# Patient Record
Sex: Female | Born: 1975 | Race: White | Hispanic: No | Marital: Married | Smoking: Never smoker
Health system: Southern US, Community
[De-identification: ages and names within clinical notes are randomized; demographics above are authoritative.]

## PROBLEM LIST (undated history)

## (undated) DIAGNOSIS — R011 Cardiac murmur, unspecified: Secondary | ICD-10-CM

## (undated) DIAGNOSIS — D649 Anemia, unspecified: Secondary | ICD-10-CM

## (undated) HISTORY — DX: Cardiac murmur, unspecified: R01.1

## (undated) HISTORY — DX: Anemia, unspecified: D64.9

## (undated) HISTORY — PX: WISDOM TOOTH EXTRACTION: SHX21

---

## 2011-01-13 ENCOUNTER — Ambulatory Visit (INDEPENDENT_AMBULATORY_CARE_PROVIDER_SITE_OTHER): Payer: BC Managed Care – PPO | Admitting: Family

## 2011-01-13 ENCOUNTER — Other Ambulatory Visit: Payer: Self-pay | Admitting: Obstetrics & Gynecology

## 2011-01-13 ENCOUNTER — Encounter: Payer: Self-pay | Admitting: Family

## 2011-01-13 VITALS — BP 139/74 | Temp 98.6°F | Ht 66.0 in | Wt 164.0 lb

## 2011-01-13 DIAGNOSIS — Z3480 Encounter for supervision of other normal pregnancy, unspecified trimester: Secondary | ICD-10-CM

## 2011-01-13 DIAGNOSIS — Z113 Encounter for screening for infections with a predominantly sexual mode of transmission: Secondary | ICD-10-CM

## 2011-01-13 DIAGNOSIS — Z3689 Encounter for other specified antenatal screening: Secondary | ICD-10-CM

## 2011-01-13 DIAGNOSIS — O283 Abnormal ultrasonic finding on antenatal screening of mother: Secondary | ICD-10-CM

## 2011-01-13 DIAGNOSIS — Z641 Problems related to multiparity: Secondary | ICD-10-CM

## 2011-01-13 DIAGNOSIS — Z1272 Encounter for screening for malignant neoplasm of vagina: Secondary | ICD-10-CM

## 2011-01-13 DIAGNOSIS — IMO0002 Reserved for concepts with insufficient information to code with codable children: Secondary | ICD-10-CM

## 2011-01-13 DIAGNOSIS — O289 Unspecified abnormal findings on antenatal screening of mother: Secondary | ICD-10-CM

## 2011-01-13 DIAGNOSIS — O09529 Supervision of elderly multigravida, unspecified trimester: Secondary | ICD-10-CM

## 2011-01-13 NOTE — Progress Notes (Signed)
Pt here for initial OB exam; relatively normal ob history; reviewed genetic screening > declined.  Exam completed; will schedule [place] and prenatal panel.    Subjective:    Lindsey Porter is a [id number] [redacted]w[redacted]d being seen today for her first obstetrical visit.  Her obstetrical history is significant for advanced maternal age. Patient does intend to breast feed. Pregnancy history fully reviewed.  Patient reports no complaints.  Filed Vitals:   01/13/11 0908 01/13/11 0958  BP: 139/74   Temp: 98.6 F (37 C)   Height:  5\' 6"  (1.676 m)  Weight: 164 lb (74.39 kg)     HISTORY: OB History    Grav Para Term Preterm Abortions TAB SAB Ect Mult Living   8 7 7      1 8      # Outc Date GA Lbr Len/2nd Wgt Sex Del Anes PTL Lv   1 TRM 7/95 [redacted]w[redacted]d  7lb7oz(3.374kg) F SVD None No Yes   2 TRM 3/97 [redacted]w[redacted]d  7lb9oz(3.43kg) M SVD None No Yes   3 TRM 4/00 [redacted]w[redacted]d  8lb2oz(3.685kg) M SVD None No Yes   4 TRM 7/03 [redacted]w[redacted]d  8lb5oz(3.771kg) F SVD None No Yes   5A TRM 7/05 [redacted]w[redacted]d  6lb9oz(2.977kg) M SVD None No Yes   5B  7/05 [redacted]w[redacted]d  5lb11oz(2.58kg) M SVD None No Yes   6 TRM 4/09 [redacted]w[redacted]d  8lb7oz(3.827kg) M SVD None No Yes   7 TRM 11/10 [redacted]w[redacted]d  8lb9oz(3.884kg) M SVD None No Yes   8 CUR              Past Medical History  Diagnosis Date  . Heart murmur   . Anemia     35 yo   Past Surgical History  Procedure Date  . Wisdom tooth extraction    No family history on file.   Exam    Uterine Size: 21 cm  Pelvic Exam:    Perineum: Hemorrhoids   Vulva: normal   Vagina:  normal mucosa   pH: n/a   Cervix: multiparous appearance, no bleeding following Pap and no cervical motion tenderness   Adnexa: normal adnexa   Bony Pelvis: proven to 8  System: Breast:  normal appearance, no masses or tenderness   Skin: normal coloration and turgor, no rashes    Neurologic: oriented, normal   Extremities: normal strength, tone, and muscle mass   HEENT extra ocular movement intact   Mouth/Teeth mucous membranes moist, pharynx  normal without lesions   Neck supple and no masses   Cardiovascular: regular rate and rhythm, no murmurs or gallops   Respiratory:  appears well, vitals normal, no respiratory distress, acyanotic, normal RR, neck free of mass or lymphadenopathy, chest clear, no wheezing, crepitations, rhonchi, normal symmetric air entry   Abdomen: soft, non-tender; bowel sounds normal; no masses,  no organomegaly   Urinary: urethral meatus normal      Assessment:    Pregnancy: [id number] There is no problem list on file for this patient.       Plan:     Initial labs drawn. Prenatal vitamins. Problem list reviewed and updated. Genetic Screening discussed: declined.  Ultrasound discussed; fetal survey: requested.  Follow up in 4 weeks.  [facility] 01/13/2011

## 2011-01-13 NOTE — Progress Notes (Signed)
p-92 

## 2011-01-14 LAB — HIV ANTIBODY (ROUTINE TESTING W REFLEX): HIV: NONREACTIVE

## 2011-01-14 LAB — OBSTETRIC PANEL
Basophils Absolute: 0 10*3/uL (ref 0.0–0.1)
Hepatitis B Surface Ag: NEGATIVE
Lymphocytes Relative: 14 % (ref 12–46)
Neutro Abs: 6.3 10*3/uL (ref 1.7–7.7)
Neutrophils Relative %: 80 % — ABNORMAL HIGH (ref 43–77)
Platelets: 249 10*3/uL (ref 150–400)
RBC: 3.5 MIL/uL — ABNORMAL LOW (ref 3.87–5.11)
RDW: 14.7 % (ref 11.5–15.5)
Rubella: 410.9 IU/mL — ABNORMAL HIGH
WBC: 7.9 10*3/uL (ref 4.0–10.5)

## 2011-01-16 LAB — GC/CHLAMYDIA PROBE AMP, URINE
Chlamydia, Swab/Urine, PCR: NEGATIVE
GC Probe Amp, Urine: NEGATIVE

## 2011-01-19 ENCOUNTER — Ambulatory Visit (HOSPITAL_COMMUNITY)
Admission: RE | Admit: 2011-01-19 | Discharge: 2011-01-19 | Disposition: A | Discharge status: Home / Self Care | Payer: BC Managed Care – PPO | Source: Ambulatory Visit | Attending: Obstetrics & Gynecology | Admitting: Obstetrics & Gynecology

## 2011-01-19 DIAGNOSIS — Z1389 Encounter for screening for other disorder: Secondary | ICD-10-CM | POA: Insufficient documentation

## 2011-01-19 DIAGNOSIS — O358XX0 Maternal care for other (suspected) fetal abnormality and damage, not applicable or unspecified: Secondary | ICD-10-CM | POA: Insufficient documentation

## 2011-01-19 DIAGNOSIS — O09529 Supervision of elderly multigravida, unspecified trimester: Secondary | ICD-10-CM | POA: Insufficient documentation

## 2011-01-19 DIAGNOSIS — Z363 Encounter for antenatal screening for malformations: Secondary | ICD-10-CM | POA: Insufficient documentation

## 2011-01-19 DIAGNOSIS — Z3689 Encounter for other specified antenatal screening: Secondary | ICD-10-CM

## 2011-01-20 LAB — CULTURE, URINE COMPREHENSIVE

## 2011-01-23 ENCOUNTER — Telehealth: Payer: Self-pay | Admitting: Advanced Practice Midwife

## 2011-01-25 ENCOUNTER — Telehealth: Payer: Self-pay | Admitting: *Deleted

## 2011-01-25 DIAGNOSIS — O359XX0 Maternal care for (suspected) fetal abnormality and damage, unspecified, not applicable or unspecified: Secondary | ICD-10-CM

## 2011-01-25 NOTE — Telephone Encounter (Signed)
Pt notified of anatomy u/s fo echogenic focal area with the fetus heart and that due to her AMA her risk for Down's syndrome is 1:79.  Quad screen was offered but pt declines.  Will schedule repeat U/S in 3 weeks.

## 2011-01-25 NOTE — Telephone Encounter (Signed)
Addended by: Lesly Dukes on: 01/25/2011 12:02 PM   Modules accepted: Orders

## 2011-02-09 ENCOUNTER — Ambulatory Visit (HOSPITAL_COMMUNITY)
Admission: RE | Admit: 2011-02-09 | Discharge: 2011-02-09 | Disposition: A | Discharge status: Home / Self Care | Payer: BC Managed Care – PPO | Source: Ambulatory Visit | Attending: Obstetrics & Gynecology | Admitting: Obstetrics & Gynecology

## 2011-02-09 DIAGNOSIS — O09529 Supervision of elderly multigravida, unspecified trimester: Secondary | ICD-10-CM | POA: Insufficient documentation

## 2011-02-09 DIAGNOSIS — O359XX0 Maternal care for (suspected) fetal abnormality and damage, unspecified, not applicable or unspecified: Secondary | ICD-10-CM

## 2011-02-09 DIAGNOSIS — Z3689 Encounter for other specified antenatal screening: Secondary | ICD-10-CM | POA: Insufficient documentation

## 2011-02-09 DIAGNOSIS — O358XX0 Maternal care for other (suspected) fetal abnormality and damage, not applicable or unspecified: Secondary | ICD-10-CM | POA: Insufficient documentation

## 2011-02-13 ENCOUNTER — Ambulatory Visit (INDEPENDENT_AMBULATORY_CARE_PROVIDER_SITE_OTHER): Payer: BC Managed Care – PPO | Admitting: Advanced Practice Midwife

## 2011-02-13 DIAGNOSIS — O0940 Supervision of pregnancy with grand multiparity, unspecified trimester: Secondary | ICD-10-CM

## 2011-02-13 DIAGNOSIS — O358XX0 Maternal care for other (suspected) fetal abnormality and damage, not applicable or unspecified: Secondary | ICD-10-CM

## 2011-02-13 NOTE — Progress Notes (Signed)
Reviewed F/U US. LVEIF seen. No other soft markers. CL 3.63 cm Discussed relationship btw LVEIF and Trisomy 21. Pt not interested in amnio. 1 hour GTT at [place].   Lindsey Porter 02/13/2011 8:30 AM

## 2011-02-13 NOTE — Progress Notes (Signed)
p=91 

## 2011-02-13 NOTE — Patient Instructions (Signed)
Pregnancy - Second Trimester The second trimester of pregnancy (3 to 6 months) is a period of rapid growth for you and your baby. At the end of the sixth month, your baby is about 9 inches long and weighs 1 1/2 pounds. You will begin to feel the baby move between 18 and 20 weeks of the pregnancy. This is called quickening. Weight gain is faster. A clear fluid (colostrum) may leak out of your breasts. You may feel small contractions of the womb (uterus). This is known as false labor or Braxton-Hicks contractions. This is like a practice for labor when the baby is ready to be born. Usually, the problems with morning sickness have usually passed by the end of your first trimester. Some women develop small dark blotches (called cholasma, mask of pregnancy) on their face that usually goes away after the baby is born. Exposure to the sun makes the blotches worse. Acne may also develop in some pregnant women and pregnant women who have acne, may find that it goes away. PRENATAL EXAMS  Blood work may continue to be done during prenatal exams. These tests are done to check on your health and the probable health of your baby. Blood work is used to follow your blood levels (hemoglobin). Anemia (low hemoglobin) is common during pregnancy. Iron and vitamins are given to help prevent this. You will also be checked for diabetes between 24 and 28 weeks of the pregnancy. Some of the previous blood tests may be repeated.   The size of the uterus is measured during each visit. This is to make sure that the baby is continuing to grow properly according to the dates of the pregnancy.   Your blood pressure is checked every prenatal visit. This is to make sure you are not getting toxemia.   Your urine is checked to make sure you do not have an infection, diabetes or protein in the urine.   Your weight is checked often to make sure gains are happening at the suggested rate. This is to ensure that both you and your baby are  growing normally.   Sometimes, an ultrasound is performed to confirm the proper growth and development of the baby. This is a test which bounces harmless sound waves off the baby so your caregiver can more accurately determine due dates.  Sometimes, a specialized test is done on the amniotic fluid surrounding the baby. This test is called an amniocentesis. The amniotic fluid is obtained by sticking a needle into the belly (abdomen). This is done to check the chromosomes in instances where there is a concern about possible genetic problems with the baby. It is also sometimes done near the end of pregnancy if an early delivery is required. In this case, it is done to help make sure the baby's lungs are mature enough for the baby to live outside of the womb. CHANGES OCCURING IN THE SECOND TRIMESTER OF PREGNANCY Your body goes through many changes during pregnancy. They vary from person to person. Talk to your caregiver about changes you notice that you are concerned about.  During the second trimester, you will likely have an increase in your appetite. It is normal to have cravings for certain foods. This varies from person to person and pregnancy to pregnancy.   Your lower abdomen will begin to bulge.   You may have to urinate more often because the uterus and baby are pressing on your bladder. It is also common to get more bladder infections during pregnancy (  pain with urination). You can help this by drinking lots of fluids and emptying your bladder before and after intercourse.   You may begin to get stretch marks on your hips, abdomen, and breasts. These are normal changes in the body during pregnancy. There are no exercises or medications to take that prevent this change.   You may begin to develop swollen and bulging veins (varicose veins) in your legs. Wearing support hose, elevating your feet for 15 minutes, 3 to 4 times a day and limiting salt in your diet helps lessen the problem.    Heartburn may develop as the uterus grows and pushes up against the stomach. Antacids recommended by your caregiver helps with this problem. Also, eating smaller meals 4 to 5 times a day helps.   Constipation can be treated with a stool softener or adding bulk to your diet. Drinking lots of fluids, vegetables, fruits, and whole grains are helpful.   Exercising is also helpful. If you have been very active up until your pregnancy, most of these activities can be continued during your pregnancy. If you have been less active, it is helpful to start an exercise program such as walking.   Hemorrhoids (varicose veins in the rectum) may develop at the end of the second trimester. Warm sitz baths and hemorrhoid cream recommended by your caregiver helps hemorrhoid problems.   Backaches may develop during this time of your pregnancy. Avoid heavy lifting, wear low heal shoes and practice good posture to help with backache problems.   Some pregnant women develop tingling and numbness of their hand and fingers because of swelling and tightening of ligaments in the wrist (carpel tunnel syndrome). This goes away after the baby is born.   As your breasts enlarge, you may have to get a bigger bra. Get a comfortable, cotton, support bra. Do not get a nursing bra until the last month of the pregnancy if you will be nursing the baby.   You may get a dark line from your belly button to the pubic area called the linea nigra.   You may develop rosy cheeks because of increase blood flow to the face.   You may develop spider looking lines of the face, neck, arms and chest. These go away after the baby is born.  HOME CARE INSTRUCTIONS   It is extremely important to avoid all smoking, herbs, alcohol, and unprescribed drugs during your pregnancy. These chemicals affect the formation and growth of the baby. Avoid these chemicals throughout the pregnancy to ensure the delivery of a healthy infant.   Most of your home  care instructions are the same as suggested for the first trimester of your pregnancy. Keep your caregiver's appointments. Follow your caregiver's instructions regarding medication use, exercise and diet.   During pregnancy, you are providing food for you and your baby. Continue to eat regular, well-balanced meals. Choose foods such as meat, fish, milk and other low fat dairy products, vegetables, fruits, and whole-grain breads and cereals. Your caregiver will tell you of the ideal weight gain.   A physical sexual relationship may be continued up until near the end of pregnancy if there are no other problems. Problems could include early (premature) leaking of amniotic fluid from the membranes, vaginal bleeding, abdominal pain, or other medical or pregnancy problems.   Exercise regularly if there are no restrictions. Check with your caregiver if you are unsure of the safety of some of your exercises. The greatest weight gain will occur in the   last 2 trimesters of pregnancy. Exercise will help you:   Control your weight.   Get you in shape for labor and delivery.   Lose weight after you have the baby.   Wear a good support or jogging bra for breast tenderness during pregnancy. This may help if worn during sleep. Pads or tissues may be used in the bra if you are leaking colostrum.   Do not use hot tubs, steam rooms or saunas throughout the pregnancy.   Wear your seat belt at all times when driving. This protects you and your baby if you are in an accident.   Avoid raw meat, uncooked cheese, cat litter boxes and soil used by cats. These carry germs that can cause birth defects in the baby.   The second trimester is also a good time to visit your dentist for your dental health if this has not been done yet. Getting your teeth cleaned is OK. Use a soft toothbrush. Brush gently during pregnancy.   It is easier to loose urine during pregnancy. Tightening up and strengthening the pelvic muscles will  help with this problem. Practice stopping your urination while you are going to the bathroom. These are the same muscles you need to strengthen. It is also the muscles you would use as if you were trying to stop from passing gas. You can practice tightening these muscles up 10 times a set and repeating this about 3 times per day. Once you know what muscles to tighten up, do not perform these exercises during urination. It is more likely to contribute to an infection by backing up the urine.   Ask for help if you have financial, counseling or nutritional needs during pregnancy. Your caregiver will be able to offer counseling for these needs as well as refer you for other special needs.   Your skin may become oily. If so, wash your face with mild soap, use non-greasy moisturizer and oil or cream based makeup.  MEDICATIONS AND DRUG USE IN PREGNANCY  Take prenatal vitamins as directed. The vitamin should contain 1 milligram of folic acid. Keep all vitamins out of reach of children. Only a couple vitamins or tablets containing iron may be fatal to a baby or young child when ingested.   Avoid use of all medications, including herbs, over-the-counter medications, not prescribed or suggested by your caregiver. Only take over-the-counter or prescription medicines for pain, discomfort, or fever as directed by your caregiver. Do not use aspirin.   Let your caregiver also know about herbs you may be using.   Alcohol is related to a number of birth defects. This includes fetal alcohol syndrome. All alcohol, in any form, should be avoided completely. Smoking will cause low birth rate and premature babies.   Street or illegal drugs are very harmful to the baby. They are absolutely forbidden. A baby born to an addicted mother will be addicted at birth. The baby will go through the same withdrawal an adult does.  SEEK MEDICAL CARE IF:  You have any concerns or worries during your pregnancy. It is better to call with  your questions if you feel they cannot wait, rather than worry about them. SEEK IMMEDIATE MEDICAL CARE IF:   An unexplained oral temperature above 102 F (38.9 C) develops, or as your caregiver suggests.   You have leaking of fluid from the vagina (birth canal). If leaking membranes are suspected, take your temperature and tell your caregiver of this when you call.   There   is vaginal spotting, bleeding, or passing clots. Tell your caregiver of the amount and how many pads are used. Light spotting in pregnancy is common, especially following intercourse.   You develop a bad smelling vaginal discharge with a change in the color from clear to white.   You continue to feel sick to your stomach (nauseated) and have no relief from remedies suggested. You vomit blood or coffee ground-like materials.   You lose more than 2 pounds of weight or gain more than 2 pounds of weight over 1 week, or as suggested by your caregiver.   You notice swelling of your face, hands, feet, or legs.   You get exposed to German measles and have never had them.   You are exposed to fifth disease or chickenpox.   You develop belly (abdominal) pain. Round ligament discomfort is a common non-cancerous (benign) cause of abdominal pain in pregnancy. Your caregiver still must evaluate you.   You develop a bad headache that does not go away.   You develop fever, diarrhea, pain with urination, or shortness of breath.   You develop visual problems, blurry, or double vision.   You fall or are in a car accident or any kind of trauma.   There is mental or physical violence at home.  Document Released: 03/21/2001 Document Revised: 12/07/2010 Document Reviewed: 09/23/2008 Exit[employer] Patient Information 2012 [employer]. 

## 2011-02-15 ENCOUNTER — Ambulatory Visit (HOSPITAL_COMMUNITY): Payer: BC Managed Care – PPO

## 2011-02-17 NOTE — Telephone Encounter (Signed)
LVEIF on [place]. Called  To discuss w/ pt. Left VM to call [place] office.

## 2011-03-22 ENCOUNTER — Ambulatory Visit (INDEPENDENT_AMBULATORY_CARE_PROVIDER_SITE_OTHER): Payer: BC Managed Care – PPO | Admitting: Obstetrics & Gynecology

## 2011-03-22 VITALS — BP 126/74 | Temp 97.6°F | Wt 175.0 lb

## 2011-03-22 DIAGNOSIS — O0940 Supervision of pregnancy with grand multiparity, unspecified trimester: Secondary | ICD-10-CM

## 2011-03-22 NOTE — Patient Instructions (Signed)
Breastfeeding BENEFITS OF BREASTFEEDING For the baby  The first milk (colostrum) helps the baby's digestive system function better.   There are antibodies from the mother in the milk that help the baby fight off infections.   The baby has a lower incidence of asthma, allergies, and SIDS (sudden infant death syndrome).   The nutrients in breast milk are better than formulas for the baby and helps the baby's brain grow better.   Babies who breastfeed have less gas, colic, and constipation.  For the mother  Breastfeeding helps develop a very special bond between mother and baby.   It is more convenient, always available at the correct temperature and cheaper than formula feeding.   It burns calories in the mother and helps with losing weight that was gained during pregnancy.   It makes the uterus contract back down to normal size faster and slows bleeding following delivery.   Breastfeeding mothers have a lower risk of developing breast cancer.  NURSE FREQUENTLY  A healthy, full-term baby may breastfeed as often as every hour or space his or her feedings to every 3 hours.   How often to nurse will vary from baby to baby. Watch your baby for signs of hunger, not the clock.   Nurse as often as the baby requests, or when you feel the need to reduce the fullness of your breasts.   Awaken the baby if it has been 3 to 4 hours since the last feeding.   Frequent feeding will help the mother make more milk and will prevent problems like sore nipples and engorgement of the breasts.  BABY'S POSITION AT THE BREAST  Whether lying down or sitting, be sure that the baby's tummy is facing your tummy.   Support the breast with 4 fingers underneath the breast and the thumb above. Make sure your fingers are well away from the nipple and baby's mouth.   Stroke the baby's lips and cheek closest to the breast gently with your finger or nipple.   When the baby's mouth is open wide enough, place  all of your nipple and as much of the dark area around the nipple as possible into your baby's mouth.   Pull the baby in close so the tip of the nose and the baby's cheeks touch the breast during the feeding.  FEEDINGS  The length of each feeding varies from baby to baby and from feeding to feeding.   The baby must suck about 2 to 3 minutes for your milk to get to him or her. This is called a "let down." For this reason, allow the baby to feed on each breast as long as he or she wants. Your baby will end the feeding when he or she has received the right balance of nutrients.   To break the suction, put your finger into the corner of the baby's mouth and slide it between his or her gums before removing your breast from his or her mouth. This will help prevent sore nipples.  REDUCING BREAST ENGORGEMENT  In the first week after your baby is born, you may experience signs of breast engorgement. When breasts are engorged, they feel heavy, warm, full, and may be tender to the touch. You can reduce engorgement if you:   Nurse frequently, every 2 to 3 hours. Mothers who breastfeed early and often have fewer problems with engorgement.   Place light ice packs on your breasts between feedings. This reduces swelling. Wrap the ice packs in a   lightweight towel to protect your skin.   Apply moist hot packs to your breast for 5 to 10 minutes before each feeding. This increases circulation and helps the milk flow.   Gently massage your breast before and during the feeding.   Make sure that the baby empties at least one breast at every feeding before switching sides.   Use a breast pump to empty the breasts if your baby is sleepy or not nursing well. You may also want to pump if you are returning to work or or you feel you are getting engorged.   Avoid bottle feeds, pacifiers or supplemental feedings of water or juice in place of breastfeeding.   Be sure the baby is latched on and positioned properly while  breastfeeding.   Prevent fatigue, stress, and anemia.   Wear a supportive bra, avoiding underwire styles.   Eat a balanced diet with enough fluids.  If you follow these suggestions, your engorgement should improve in 24 to 48 hours. If you are still experiencing difficulty, call your lactation consultant or caregiver. IS MY BABY GETTING ENOUGH MILK? Sometimes, mothers worry about whether their babies are getting enough milk. You can be assured that your baby is getting enough milk if:  The baby is actively sucking and you hear swallowing.   The baby nurses at least 8 to 12 times in a 24 hour time period. Nurse your baby until he or she unlatches or falls asleep at the first breast (at least 10 to 20 minutes), then offer the second side.   The baby is wetting 5 to 6 disposable diapers (6 to 8 cloth diapers) in a 24 hour period by 5 to 6 days of age.   The baby is having at least 2 to 3 stools every 24 hours for the first few months. Breast milk is all the food your baby needs. It is not necessary for your baby to have water or formula. In fact, to help your breasts make more milk, it is best not to give your baby supplemental feedings during the early weeks.   The stool should be soft and yellow.   The baby should gain 4 to 7 ounces per week after he is 4 days old.  TAKE CARE OF YOURSELF Take care of your breasts by:  Bathing or showering daily.   Avoiding the use of soaps on your nipples.   Start feedings on your left breast at one feeding and on your right breast at the next feeding.   You will notice an increase in your milk supply 2 to 5 days after delivery. You may feel some discomfort from engorgement, which makes your breasts very firm and often tender. Engorgement "peaks" out within 24 to 48 hours. In the meantime, apply warm moist towels to your breasts for 5 to 10 minutes before feeding. Gentle massage and expression of some milk before feeding will soften your breasts, making  it easier for your baby to latch on. Wear a well fitting nursing bra and air dry your nipples for 10 to 15 minutes after each feeding.   Only use cotton bra pads.   Only use pure lanolin on your nipples after nursing. You do not need to wash it off before nursing.  Take care of yourself by:   Eating well-balanced meals and nutritious snacks.   Drinking milk, fruit juice, and water to satisfy your thirst (about 8 glasses a day).   Getting plenty of rest.   Increasing calcium in   your diet (1200 mg a day).   Avoiding foods that you notice affect the baby in a bad way.  SEEK MEDICAL CARE IF:   You have any questions or difficulty with breastfeeding.   You need help.   You have a hard, red, sore area on your breast, accompanied by a fever of 100.5 F (38.1 C) or more.   Your baby is too sleepy to eat well or is having trouble sleeping.   Your baby is wetting less than 6 diapers per day, by 5 days of age.   Your baby's skin or white part of his or her eyes is more yellow than it was in the hospital.   You feel depressed.  Document Released: 03/27/2005 Document Revised: 12/07/2010 Document Reviewed: 11/09/2008 [employer] Patient Information 2012 [employer]. 

## 2011-03-22 NOTE — Progress Notes (Signed)
No problems.  GCT today.  [employer] [place] not paying for midwifery visits.  Pt will call onemore time.  If still not getting them to pay, will have Jasmine December call.  GCT today.

## 2011-03-22 NOTE — Progress Notes (Signed)
Addended by: Granville Lewis on: 03/22/2011 11:53 AM   Modules accepted: Orders

## 2011-03-22 NOTE — Progress Notes (Signed)
P - 97 

## 2011-03-23 ENCOUNTER — Telehealth: Payer: Self-pay | Admitting: *Deleted

## 2011-03-23 LAB — CBC
MCH: 31.5 pg (ref 26.0–34.0)
MCHC: 31.8 g/dL (ref 30.0–36.0)
MCV: 99.1 fL (ref 78.0–100.0)
Platelets: 265 10*3/uL (ref 150–400)
RDW: 14 % (ref 11.5–15.5)

## 2011-03-23 LAB — RPR

## 2011-03-23 LAB — GLUCOSE TOLERANCE, 1 HOUR: Glucose, 1 Hour GTT: 125 mg/dL (ref 70–140)

## 2011-03-23 NOTE — Telephone Encounter (Signed)
Left message on home phone that her 1 hr Glucola test was within normal range.

## 2011-04-11 NOTE — L&D Delivery Note (Signed)
Delivery Note At 12:36 AM a viable and healthy female was delivered via Vaginal, Spontaneous Delivery (Presentation: ; Occiput Anterior).  APGAR: 8, 9; weight 9 lb 1.5 oz (4125 g).   Placenta status: Intact, Spontaneous.  Cord: 3 vessels with the following complications: Marland Kitchen  Mild shoulder dystocia  After delivery of head, attempted delivery of anterior shoulder without success. McRoberts employed and attempted delivery of posterior shoulder Suprapubic pressure applied and I was able to deliver the posterior shoulder. Total time was less than a minute. Good movement of baby arms bilaterally after birth  Anesthesia: None  Episiotomy: None Lacerations: abrasion midline perineum, not bleeding, not repaired Suture Repair:  Est. Blood Loss (mL): 100  Mom to postpartum.  Baby to nursery-stable.  [facility] 06/15/2011, 1:40 AM

## 2011-04-14 ENCOUNTER — Ambulatory Visit (INDEPENDENT_AMBULATORY_CARE_PROVIDER_SITE_OTHER): Payer: 59 | Admitting: Advanced Practice Midwife

## 2011-04-14 VITALS — BP 121/66 | Temp 97.1°F | Wt 172.0 lb

## 2011-04-14 DIAGNOSIS — Z3480 Encounter for supervision of other normal pregnancy, unspecified trimester: Secondary | ICD-10-CM

## 2011-04-14 DIAGNOSIS — J069 Acute upper respiratory infection, unspecified: Secondary | ICD-10-CM

## 2011-04-14 DIAGNOSIS — O0940 Supervision of pregnancy with grand multiparity, unspecified trimester: Secondary | ICD-10-CM

## 2011-04-14 NOTE — Progress Notes (Signed)
p-99  URI

## 2011-04-14 NOTE — Progress Notes (Signed)
MHR 88. Whole family has URI. Discussed comfort measures, meds,increase rest and fluids.

## 2011-04-14 NOTE — Patient Instructions (Signed)
Pregnancy - Third Trimester The third trimester of pregnancy (the last 3 months) is a period of the most rapid growth for you and your baby. The baby approaches a length of 20 inches and a weight of 6 to 10 pounds. The baby is adding on fat and getting ready for life outside your body. While inside, babies have periods of sleeping and waking, suck their thumbs, and hiccups. You can often feel small contractions of the uterus. This is false labor. It is also called Braxton-Hicks contractions. This is like a practice for labor. The usual problems in this stage of pregnancy include more difficulty breathing, swelling of the hands and feet from water retention, and having to urinate more often because of the uterus and baby pressing on your bladder.  PRENATAL EXAMS  Blood work may continue to be done during prenatal exams. These tests are done to check on your health and the probable health of your baby. Blood work is used to follow your blood levels (hemoglobin). Anemia (low hemoglobin) is common during pregnancy. Iron and vitamins are given to help prevent this. You may also continue to be checked for diabetes. Some of the past blood tests may be done again.   The size of the uterus is measured during each visit. This makes sure your baby is growing properly according to your pregnancy dates.   Your blood pressure is checked every prenatal visit. This is to make sure you are not getting toxemia.   Your urine is checked every prenatal visit for infection, diabetes and protein.   Your weight is checked at each visit. This is done to make sure gains are happening at the suggested rate and that you and your baby are growing normally.   Sometimes, an ultrasound is performed to confirm the position and the proper growth and development of the baby. This is a test done that bounces harmless sound waves off the baby so your caregiver can more accurately determine due dates.   Discuss the type of pain  medication and anesthesia you will have during your labor and delivery.   Discuss the possibility and anesthesia if a Cesarean Section might be necessary.   Inform your caregiver if there is any mental or physical violence at home.  Sometimes, a specialized non-stress test, contraction stress test and biophysical profile are done to make sure the baby is not having a problem. Checking the amniotic fluid surrounding the baby is called an amniocentesis. The amniotic fluid is removed by sticking a needle into the belly (abdomen). This is sometimes done near the end of pregnancy if an early delivery is required. In this case, it is done to help make sure the baby's lungs are mature enough for the baby to live outside of the womb. If the lungs are not mature and it is unsafe to deliver the baby, an injection of cortisone medication is given to the mother 1 to 2 days before the delivery. This helps the baby's lungs mature and makes it safer to deliver the baby. CHANGES OCCURING IN THE THIRD TRIMESTER OF PREGNANCY Your body goes through many changes during pregnancy. They vary from person to person. Talk to your caregiver about changes you notice and are concerned about.  During the last trimester, you have probably had an increase in your appetite. It is normal to have cravings for certain foods. This varies from person to person and pregnancy to pregnancy.   You may begin to get stretch marks on your hips,   abdomen, and breasts. These are normal changes in the body during pregnancy. There are no exercises or medications to take which prevent this change.   Constipation may be treated with a stool softener or adding bulk to your diet. Drinking lots of fluids, fiber in vegetables, fruits, and whole grains are helpful.   Exercising is also helpful. If you have been very active up until your pregnancy, most of these activities can be continued during your pregnancy. If you have been less active, it is helpful  to start an exercise program such as walking. Consult your caregiver before starting exercise programs.   Avoid all smoking, alcohol, un-prescribed drugs, herbs and "street drugs" during your pregnancy. These chemicals affect the formation and growth of the baby. Avoid chemicals throughout the pregnancy to ensure the delivery of a healthy infant.   Backache, varicose veins and hemorrhoids may develop or get worse.   You will tire more easily in the third trimester, which is normal.   The baby's movements may be stronger and more often.   You may become short of breath easily.   Your belly button may stick out.   A yellow discharge may leak from your breasts called colostrum.   You may have a bloody mucus discharge. This usually occurs a few days to a week before labor begins.  HOME CARE INSTRUCTIONS   Keep your caregiver's appointments. Follow your caregiver's instructions regarding medication use, exercise, and diet.   During pregnancy, you are providing food for you and your baby. Continue to eat regular, well-balanced meals. Choose foods such as meat, fish, milk and other low fat dairy products, vegetables, fruits, and whole-grain breads and cereals. Your caregiver will tell you of the ideal weight gain.   A physical sexual relationship may be continued throughout pregnancy if there are no other problems such as early (premature) leaking of amniotic fluid from the membranes, vaginal bleeding, or belly (abdominal) pain.   Exercise regularly if there are no restrictions. Check with your caregiver if you are unsure of the safety of your exercises. Greater weight gain will occur in the last 2 trimesters of pregnancy. Exercising helps:   Control your weight.   Get you in shape for labor and delivery.   You lose weight after you deliver.   Rest a lot with legs elevated, or as needed for leg cramps or low back pain.   Wear a good support or jogging bra for breast tenderness during  pregnancy. This may help if worn during sleep. Pads or tissues may be used in the bra if you are leaking colostrum.   Do not use hot tubs, steam rooms, or saunas.   Wear your seat belt when driving. This protects you and your baby if you are in an accident.   Avoid raw meat, cat litter boxes and soil used by cats. These carry germs that can cause birth defects in the baby.   It is easier to loose urine during pregnancy. Tightening up and strengthening the pelvic muscles will help with this problem. You can practice stopping your urination while you are going to the bathroom. These are the same muscles you need to strengthen. It is also the muscles you would use if you were trying to stop from passing gas. You can practice tightening these muscles up 10 times a set and repeating this about 3 times per day. Once you know what muscles to tighten up, do not perform these exercises during urination. It is more likely   to cause an infection by backing up the urine.   Ask for help if you have financial, counseling or nutritional needs during pregnancy. Your caregiver will be able to offer counseling for these needs as well as refer you for other special needs.   Make a list of emergency phone numbers and have them available.   Plan on getting help from family or friends when you go home from the hospital.   Make a trial run to the hospital.   Take prenatal classes with the father to understand, practice and ask questions about the labor and delivery.   Prepare the baby's room/nursery.   Do not travel out of the city unless it is absolutely necessary and with the advice of your caregiver.   Wear only low or no heal shoes to have better balance and prevent falling.  MEDICATIONS AND DRUG USE IN PREGNANCY  Take prenatal vitamins as directed. The vitamin should contain 1 milligram of folic acid. Keep all vitamins out of reach of children. Only a couple vitamins or tablets containing iron may be fatal  to a baby or young child when ingested.   Avoid use of all medications, including herbs, over-the-counter medications, not prescribed or suggested by your caregiver. Only take over-the-counter or prescription medicines for pain, discomfort, or fever as directed by your caregiver. Do not use aspirin, ibuprofen (Motrin, Advil, Nuprin) or naproxen (Aleve) unless OK'd by your caregiver.   Let your caregiver also know about herbs you may be using.   Alcohol is related to a number of birth defects. This includes fetal alcohol syndrome. All alcohol, in any form, should be avoided completely. Smoking will cause low birth rate and premature babies.   Street/illegal drugs are very harmful to the baby. They are absolutely forbidden. A baby born to an addicted mother will be addicted at birth. The baby will go through the same withdrawal an adult does.  SEEK MEDICAL CARE IF: You have any concerns or worries during your pregnancy. It is better to call with your questions if you feel they cannot wait, rather than worry about them. DECISIONS ABOUT CIRCUMCISION You may or may not know the sex of your baby. If you know your baby is a boy, it may be time to think about circumcision. Circumcision is the removal of the foreskin of the penis. This is the skin that covers the sensitive end of the penis. There is no proven medical need for this. Often this decision is made on what is popular at the time or based upon religious beliefs and social issues. You can discuss these issues with your caregiver or pediatrician. SEEK IMMEDIATE MEDICAL CARE IF:   An unexplained oral temperature above 102 F (38.9 C) develops, or as your caregiver suggests.   You have leaking of fluid from the vagina (birth canal). If leaking membranes are suspected, take your temperature and tell your caregiver of this when you call.   There is vaginal spotting, bleeding or passing clots. Tell your caregiver of the amount and how many pads are  used.   You develop a bad smelling vaginal discharge with a change in the color from clear to white.   You develop vomiting that lasts more than 24 hours.   You develop chills or fever.   You develop shortness of breath.   You develop burning on urination.   You loose more than 2 pounds of weight or gain more than 2 pounds of weight or as suggested by your   caregiver.   You notice sudden swelling of your face, hands, and feet or legs.   You develop belly (abdominal) pain. Round ligament discomfort is a common non-cancerous (benign) cause of abdominal pain in pregnancy. Your caregiver still must evaluate you.   You develop a severe headache that does not go away.   You develop visual problems, blurred or double vision.   If you have not felt your baby move for more than 1 hour. If you think the baby is not moving as much as usual, eat something with sugar in it and lie down on your left side for an hour. The baby should move at least 4 to 5 times per hour. Call right away if your baby moves less than that.   You fall, are in a car accident or any kind of trauma.   There is mental or physical violence at home.  Document Released: 03/21/2001 Document Revised: 12/07/2010 Document Reviewed: 09/23/2008 [employer] Patient Information 2012 [employer]. 

## 2011-05-05 ENCOUNTER — Other Ambulatory Visit: Payer: Self-pay | Admitting: Family

## 2011-05-05 ENCOUNTER — Ambulatory Visit (INDEPENDENT_AMBULATORY_CARE_PROVIDER_SITE_OTHER): Payer: 59 | Admitting: Family

## 2011-05-05 DIAGNOSIS — O09529 Supervision of elderly multigravida, unspecified trimester: Secondary | ICD-10-CM

## 2011-05-05 DIAGNOSIS — IMO0002 Reserved for concepts with insufficient information to code with codable children: Secondary | ICD-10-CM

## 2011-05-05 DIAGNOSIS — Z3480 Encounter for supervision of other normal pregnancy, unspecified trimester: Secondary | ICD-10-CM

## 2011-05-05 NOTE — Progress Notes (Signed)
Addended by: Melissa Noon on: 05/05/2011 11:39 AM   Modules accepted: Orders

## 2011-05-05 NOTE — Progress Notes (Signed)
p-90  36 wk labs

## 2011-05-05 NOTE — Progress Notes (Signed)
No questions or concerns; collect GBS, GC/CT

## 2011-05-09 LAB — GC/CHLAMYDIA PROBE AMP, GENITAL
Chlamydia, DNA Probe: NEGATIVE
GC Probe Amp, Genital: NEGATIVE

## 2011-05-12 ENCOUNTER — Ambulatory Visit (INDEPENDENT_AMBULATORY_CARE_PROVIDER_SITE_OTHER): Payer: 59 | Admitting: Advanced Practice Midwife

## 2011-05-12 VITALS — BP 116/62 | Temp 98.4°F | Wt 173.0 lb

## 2011-05-12 DIAGNOSIS — Z3480 Encounter for supervision of other normal pregnancy, unspecified trimester: Secondary | ICD-10-CM

## 2011-05-12 NOTE — Progress Notes (Signed)
No concerns. Reviewed neg GBS.

## 2011-05-12 NOTE — Progress Notes (Signed)
p-85 

## 2011-05-12 NOTE — Patient Instructions (Signed)
Pregnancy - Third Trimester The third trimester of pregnancy (the last 3 months) is a period of the most rapid growth for you and your baby. The baby approaches a length of 20 inches and a weight of 6 to 10 pounds. The baby is adding on fat and getting ready for life outside your body. While inside, babies have periods of sleeping and waking, suck their thumbs, and hiccups. You can often feel small contractions of the uterus. This is false labor. It is also called Braxton-Hicks contractions. This is like a practice for labor. The usual problems in this stage of pregnancy include more difficulty breathing, swelling of the hands and feet from water retention, and having to urinate more often because of the uterus and baby pressing on your bladder.  PRENATAL EXAMS  Blood work may continue to be done during prenatal exams. These tests are done to check on your health and the probable health of your baby. Blood work is used to follow your blood levels (hemoglobin). Anemia (low hemoglobin) is common during pregnancy. Iron and vitamins are given to help prevent this. You may also continue to be checked for diabetes. Some of the past blood tests may be done again.   The size of the uterus is measured during each visit. This makes sure your baby is growing properly according to your pregnancy dates.   Your blood pressure is checked every prenatal visit. This is to make sure you are not getting toxemia.   Your urine is checked every prenatal visit for infection, diabetes and protein.   Your weight is checked at each visit. This is done to make sure gains are happening at the suggested rate and that you and your baby are growing normally.   Sometimes, an ultrasound is performed to confirm the position and the proper growth and development of the baby. This is a test done that bounces harmless sound waves off the baby so your caregiver can more accurately determine due dates.   Discuss the type of pain  medication and anesthesia you will have during your labor and delivery.   Discuss the possibility and anesthesia if a Cesarean Section might be necessary.   Inform your caregiver if there is any mental or physical violence at home.  Sometimes, a specialized non-stress test, contraction stress test and biophysical profile are done to make sure the baby is not having a problem. Checking the amniotic fluid surrounding the baby is called an amniocentesis. The amniotic fluid is removed by sticking a needle into the belly (abdomen). This is sometimes done near the end of pregnancy if an early delivery is required. In this case, it is done to help make sure the baby's lungs are mature enough for the baby to live outside of the womb. If the lungs are not mature and it is unsafe to deliver the baby, an injection of cortisone medication is given to the mother 1 to 2 days before the delivery. This helps the baby's lungs mature and makes it safer to deliver the baby. CHANGES OCCURING IN THE THIRD TRIMESTER OF PREGNANCY Your body goes through many changes during pregnancy. They vary from person to person. Talk to your caregiver about changes you notice and are concerned about.  During the last trimester, you have probably had an increase in your appetite. It is normal to have cravings for certain foods. This varies from person to person and pregnancy to pregnancy.   You may begin to get stretch marks on your hips,   abdomen, and breasts. These are normal changes in the body during pregnancy. There are no exercises or medications to take which prevent this change.   Constipation may be treated with a stool softener or adding bulk to your diet. Drinking lots of fluids, fiber in vegetables, fruits, and whole grains are helpful.   Exercising is also helpful. If you have been very active up until your pregnancy, most of these activities can be continued during your pregnancy. If you have been less active, it is helpful  to start an exercise program such as walking. Consult your caregiver before starting exercise programs.   Avoid all smoking, alcohol, un-prescribed drugs, herbs and "street drugs" during your pregnancy. These chemicals affect the formation and growth of the baby. Avoid chemicals throughout the pregnancy to ensure the delivery of a healthy infant.   Backache, varicose veins and hemorrhoids may develop or get worse.   You will tire more easily in the third trimester, which is normal.   The baby's movements may be stronger and more often.   You may become short of breath easily.   Your belly button may stick out.   A yellow discharge may leak from your breasts called colostrum.   You may have a bloody mucus discharge. This usually occurs a few days to a week before labor begins.  HOME CARE INSTRUCTIONS   Keep your caregiver's appointments. Follow your caregiver's instructions regarding medication use, exercise, and diet.   During pregnancy, you are providing food for you and your baby. Continue to eat regular, well-balanced meals. Choose foods such as meat, fish, milk and other low fat dairy products, vegetables, fruits, and whole-grain breads and cereals. Your caregiver will tell you of the ideal weight gain.   A physical sexual relationship may be continued throughout pregnancy if there are no other problems such as early (premature) leaking of amniotic fluid from the membranes, vaginal bleeding, or belly (abdominal) pain.   Exercise regularly if there are no restrictions. Check with your caregiver if you are unsure of the safety of your exercises. Greater weight gain will occur in the last 2 trimesters of pregnancy. Exercising helps:   Control your weight.   Get you in shape for labor and delivery.   You lose weight after you deliver.   Rest a lot with legs elevated, or as needed for leg cramps or low back pain.   Wear a good support or jogging bra for breast tenderness during  pregnancy. This may help if worn during sleep. Pads or tissues may be used in the bra if you are leaking colostrum.   Do not use hot tubs, steam rooms, or saunas.   Wear your seat belt when driving. This protects you and your baby if you are in an accident.   Avoid raw meat, cat litter boxes and soil used by cats. These carry germs that can cause birth defects in the baby.   It is easier to loose urine during pregnancy. Tightening up and strengthening the pelvic muscles will help with this problem. You can practice stopping your urination while you are going to the bathroom. These are the same muscles you need to strengthen. It is also the muscles you would use if you were trying to stop from passing gas. You can practice tightening these muscles up 10 times a set and repeating this about 3 times per day. Once you know what muscles to tighten up, do not perform these exercises during urination. It is more likely   to cause an infection by backing up the urine.   Ask for help if you have financial, counseling or nutritional needs during pregnancy. Your caregiver will be able to offer counseling for these needs as well as refer you for other special needs.   Make a list of emergency phone numbers and have them available.   Plan on getting help from family or friends when you go home from the hospital.   Make a trial run to the hospital.   Take prenatal classes with the father to understand, practice and ask questions about the labor and delivery.   Prepare the baby's room/nursery.   Do not travel out of the city unless it is absolutely necessary and with the advice of your caregiver.   Wear only low or no heal shoes to have better balance and prevent falling.  MEDICATIONS AND DRUG USE IN PREGNANCY  Take prenatal vitamins as directed. The vitamin should contain 1 milligram of folic acid. Keep all vitamins out of reach of children. Only a couple vitamins or tablets containing iron may be fatal  to a baby or young child when ingested.   Avoid use of all medications, including herbs, over-the-counter medications, not prescribed or suggested by your caregiver. Only take over-the-counter or prescription medicines for pain, discomfort, or fever as directed by your caregiver. Do not use aspirin, ibuprofen (Motrin, Advil, Nuprin) or naproxen (Aleve) unless OK'd by your caregiver.   Let your caregiver also know about herbs you may be using.   Alcohol is related to a number of birth defects. This includes fetal alcohol syndrome. All alcohol, in any form, should be avoided completely. Smoking will cause low birth rate and premature babies.   Street/illegal drugs are very harmful to the baby. They are absolutely forbidden. A baby born to an addicted mother will be addicted at birth. The baby will go through the same withdrawal an adult does.  SEEK MEDICAL CARE IF: You have any concerns or worries during your pregnancy. It is better to call with your questions if you feel they cannot wait, rather than worry about them. DECISIONS ABOUT CIRCUMCISION You may or may not know the sex of your baby. If you know your baby is a boy, it may be time to think about circumcision. Circumcision is the removal of the foreskin of the penis. This is the skin that covers the sensitive end of the penis. There is no proven medical need for this. Often this decision is made on what is popular at the time or based upon religious beliefs and social issues. You can discuss these issues with your caregiver or pediatrician. SEEK IMMEDIATE MEDICAL CARE IF:   An unexplained oral temperature above 102 F (38.9 C) develops, or as your caregiver suggests.   You have leaking of fluid from the vagina (birth canal). If leaking membranes are suspected, take your temperature and tell your caregiver of this when you call.   There is vaginal spotting, bleeding or passing clots. Tell your caregiver of the amount and how many pads are  used.   You develop a bad smelling vaginal discharge with a change in the color from clear to white.   You develop vomiting that lasts more than 24 hours.   You develop chills or fever.   You develop shortness of breath.   You develop burning on urination.   You loose more than 2 pounds of weight or gain more than 2 pounds of weight or as suggested by your   caregiver.   You notice sudden swelling of your face, hands, and feet or legs.   You develop belly (abdominal) pain. Round ligament discomfort is a common non-cancerous (benign) cause of abdominal pain in pregnancy. Your caregiver still must evaluate you.   You develop a severe headache that does not go away.   You develop visual problems, blurred or double vision.   If you have not felt your baby move for more than 1 hour. If you think the baby is not moving as much as usual, eat something with sugar in it and lie down on your left side for an hour. The baby should move at least 4 to 5 times per hour. Call right away if your baby moves less than that.   You fall, are in a car accident or any kind of trauma.   There is mental or physical violence at home.  Document Released: 03/21/2001 Document Revised: 12/07/2010 Document Reviewed: 09/23/2008 [employer] Patient Information 2012 [employer]. 

## 2011-05-22 ENCOUNTER — Ambulatory Visit (INDEPENDENT_AMBULATORY_CARE_PROVIDER_SITE_OTHER): Payer: 59 | Admitting: Family

## 2011-05-22 DIAGNOSIS — O0940 Supervision of pregnancy with grand multiparity, unspecified trimester: Secondary | ICD-10-CM

## 2011-05-22 NOTE — Progress Notes (Signed)
No questions or concerns; reviewed GBS & GC/CT results.

## 2011-05-22 NOTE — Progress Notes (Signed)
p-92 

## 2011-05-29 ENCOUNTER — Ambulatory Visit (INDEPENDENT_AMBULATORY_CARE_PROVIDER_SITE_OTHER): Payer: 59 | Admitting: Physician Assistant

## 2011-05-29 DIAGNOSIS — Z3480 Encounter for supervision of other normal pregnancy, unspecified trimester: Secondary | ICD-10-CM

## 2011-05-29 NOTE — Progress Notes (Signed)
p-79 

## 2011-05-29 NOTE — Progress Notes (Signed)
No complaints, irregular contractions. Membranes sweep attempted. Labor precautions.

## 2011-05-29 NOTE — Patient Instructions (Signed)

## 2011-06-05 ENCOUNTER — Ambulatory Visit (INDEPENDENT_AMBULATORY_CARE_PROVIDER_SITE_OTHER): Payer: 59 | Admitting: Advanced Practice Midwife

## 2011-06-05 VITALS — BP 123/74 | Temp 98.4°F | Wt 177.0 lb

## 2011-06-05 DIAGNOSIS — O0940 Supervision of pregnancy with grand multiparity, unspecified trimester: Secondary | ICD-10-CM

## 2011-06-05 NOTE — Patient Instructions (Signed)

## 2011-06-05 NOTE — Progress Notes (Signed)
Increased UC's this morning. Declines sweeping of membranes. Baby very active on exam. NST at [place]. Discussed IOL at 41 weeks. Pt declines at this time. FKCs.

## 2011-06-05 NOTE — Progress Notes (Signed)
p-94  Lost mucous plug Wed.

## 2011-06-12 ENCOUNTER — Encounter: Payer: Self-pay | Admitting: Obstetrics and Gynecology

## 2011-06-12 ENCOUNTER — Ambulatory Visit (INDEPENDENT_AMBULATORY_CARE_PROVIDER_SITE_OTHER): Payer: 59 | Admitting: Obstetrics and Gynecology

## 2011-06-12 VITALS — BP 143/81 | Temp 98.5°F | Wt 178.0 lb

## 2011-06-12 DIAGNOSIS — O0940 Supervision of pregnancy with grand multiparity, unspecified trimester: Secondary | ICD-10-CM

## 2011-06-12 DIAGNOSIS — O09529 Supervision of elderly multigravida, unspecified trimester: Secondary | ICD-10-CM

## 2011-06-12 NOTE — Progress Notes (Signed)
BP recheck 125/77. Will recheck in 3 days. Reviewed dating criteria. Based on her report of calendar sure LMP and 35 day interval of menstrual cycle now [redacted]w[redacted]d (1 day variation from initial [place] [redacted]w[redacted]d). EFM being done today due to previously documented GA: reactive with 125-130 baqseline. Reviewed EIF and Down's risk 1:72, GBS neg . Lives in [place]. Does not want IOL. Kick counts. Recheck BP in 3-4 days.

## 2011-06-12 NOTE — Progress Notes (Signed)
p=95 

## 2011-06-12 NOTE — Patient Instructions (Signed)
Pregnancy - Third Trimester The third trimester of pregnancy (the last 3 months) is a period of the most rapid growth for you and your baby. The baby approaches a length of 20 inches and a weight of 6 to 10 pounds. The baby is adding on fat and getting ready for life outside your body. While inside, babies have periods of sleeping and waking, suck their thumbs, and hiccups. You can often feel small contractions of the uterus. This is false labor. It is also called Braxton-Hicks contractions. This is like a practice for labor. The usual problems in this stage of pregnancy include more difficulty breathing, swelling of the hands and feet from water retention, and having to urinate more often because of the uterus and baby pressing on your bladder.  PRENATAL EXAMS  Blood work may continue to be done during prenatal exams. These tests are done to check on your health and the probable health of your baby. Blood work is used to follow your blood levels (hemoglobin). Anemia (low hemoglobin) is common during pregnancy. Iron and vitamins are given to help prevent this. You may also continue to be checked for diabetes. Some of the past blood tests may be done again.   The size of the uterus is measured during each visit. This makes sure your baby is growing properly according to your pregnancy dates.   Your blood pressure is checked every prenatal visit. This is to make sure you are not getting toxemia.   Your urine is checked every prenatal visit for infection, diabetes and protein.   Your weight is checked at each visit. This is done to make sure gains are happening at the suggested rate and that you and your baby are growing normally.   Sometimes, an ultrasound is performed to confirm the position and the proper growth and development of the baby. This is a test done that bounces harmless sound waves off the baby so your caregiver can more accurately determine due dates.   Discuss the type of pain  medication and anesthesia you will have during your labor and delivery.   Discuss the possibility and anesthesia if a Cesarean Section might be necessary.   Inform your caregiver if there is any mental or physical violence at home.  Sometimes, a specialized non-stress test, contraction stress test and biophysical profile are done to make sure the baby is not having a problem. Checking the amniotic fluid surrounding the baby is called an amniocentesis. The amniotic fluid is removed by sticking a needle into the belly (abdomen). This is sometimes done near the end of pregnancy if an early delivery is required. In this case, it is done to help make sure the baby's lungs are mature enough for the baby to live outside of the womb. If the lungs are not mature and it is unsafe to deliver the baby, an injection of cortisone medication is given to the mother 1 to 2 days before the delivery. This helps the baby's lungs mature and makes it safer to deliver the baby. CHANGES OCCURING IN THE THIRD TRIMESTER OF PREGNANCY Your body goes through many changes during pregnancy. They vary from person to person. Talk to your caregiver about changes you notice and are concerned about.  During the last trimester, you have probably had an increase in your appetite. It is normal to have cravings for certain foods. This varies from person to person and pregnancy to pregnancy.   You may begin to get stretch marks on your hips,   abdomen, and breasts. These are normal changes in the body during pregnancy. There are no exercises or medications to take which prevent this change.   Constipation may be treated with a stool softener or adding bulk to your diet. Drinking lots of fluids, fiber in vegetables, fruits, and whole grains are helpful.   Exercising is also helpful. If you have been very active up until your pregnancy, most of these activities can be continued during your pregnancy. If you have been less active, it is helpful  to start an exercise program such as walking. Consult your caregiver before starting exercise programs.   Avoid all smoking, alcohol, un-prescribed drugs, herbs and "street drugs" during your pregnancy. These chemicals affect the formation and growth of the baby. Avoid chemicals throughout the pregnancy to ensure the delivery of a healthy infant.   Backache, varicose veins and hemorrhoids may develop or get worse.   You will tire more easily in the third trimester, which is normal.   The baby's movements may be stronger and more often.   You may become short of breath easily.   Your belly button may stick out.   A yellow discharge may leak from your breasts called colostrum.   You may have a bloody mucus discharge. This usually occurs a few days to a week before labor begins.  HOME CARE INSTRUCTIONS   Keep your caregiver's appointments. Follow your caregiver's instructions regarding medication use, exercise, and diet.   During pregnancy, you are providing food for you and your baby. Continue to eat regular, well-balanced meals. Choose foods such as meat, fish, milk and other low fat dairy products, vegetables, fruits, and whole-grain breads and cereals. Your caregiver will tell you of the ideal weight gain.   A physical sexual relationship may be continued throughout pregnancy if there are no other problems such as early (premature) leaking of amniotic fluid from the membranes, vaginal bleeding, or belly (abdominal) pain.   Exercise regularly if there are no restrictions. Check with your caregiver if you are unsure of the safety of your exercises. Greater weight gain will occur in the last 2 trimesters of pregnancy. Exercising helps:   Control your weight.   Get you in shape for labor and delivery.   You lose weight after you deliver.   Rest a lot with legs elevated, or as needed for leg cramps or low back pain.   Wear a good support or jogging bra for breast tenderness during  pregnancy. This may help if worn during sleep. Pads or tissues may be used in the bra if you are leaking colostrum.   Do not use hot tubs, steam rooms, or saunas.   Wear your seat belt when driving. This protects you and your baby if you are in an accident.   Avoid raw meat, cat litter boxes and soil used by cats. These carry germs that can cause birth defects in the baby.   It is easier to loose urine during pregnancy. Tightening up and strengthening the pelvic muscles will help with this problem. You can practice stopping your urination while you are going to the bathroom. These are the same muscles you need to strengthen. It is also the muscles you would use if you were trying to stop from passing gas. You can practice tightening these muscles up 10 times a set and repeating this about 3 times per day. Once you know what muscles to tighten up, do not perform these exercises during urination. It is more likely   to cause an infection by backing up the urine.   Ask for help if you have financial, counseling or nutritional needs during pregnancy. Your caregiver will be able to offer counseling for these needs as well as refer you for other special needs.   Make a list of emergency phone numbers and have them available.   Plan on getting help from family or friends when you go home from the hospital.   Make a trial run to the hospital.   Take prenatal classes with the father to understand, practice and ask questions about the labor and delivery.   Prepare the baby's room/nursery.   Do not travel out of the city unless it is absolutely necessary and with the advice of your caregiver.   Wear only low or no heal shoes to have better balance and prevent falling.  MEDICATIONS AND DRUG USE IN PREGNANCY  Take prenatal vitamins as directed. The vitamin should contain 1 milligram of folic acid. Keep all vitamins out of reach of children. Only a couple vitamins or tablets containing iron may be fatal  to a baby or young child when ingested.   Avoid use of all medications, including herbs, over-the-counter medications, not prescribed or suggested by your caregiver. Only take over-the-counter or prescription medicines for pain, discomfort, or fever as directed by your caregiver. Do not use aspirin, ibuprofen (Motrin, Advil, Nuprin) or naproxen (Aleve) unless OK'd by your caregiver.   Let your caregiver also know about herbs you may be using.   Alcohol is related to a number of birth defects. This includes fetal alcohol syndrome. All alcohol, in any form, should be avoided completely. Smoking will cause low birth rate and premature babies.   Street/illegal drugs are very harmful to the baby. They are absolutely forbidden. A baby born to an addicted mother will be addicted at birth. The baby will go through the same withdrawal an adult does.  SEEK MEDICAL CARE IF: You have any concerns or worries during your pregnancy. It is better to call with your questions if you feel they cannot wait, rather than worry about them. DECISIONS ABOUT CIRCUMCISION You may or may not know the sex of your baby. If you know your baby is a boy, it may be time to think about circumcision. Circumcision is the removal of the foreskin of the penis. This is the skin that covers the sensitive end of the penis. There is no proven medical need for this. Often this decision is made on what is popular at the time or based upon religious beliefs and social issues. You can discuss these issues with your caregiver or pediatrician. SEEK IMMEDIATE MEDICAL CARE IF:   An unexplained oral temperature above 102 F (38.9 C) develops, or as your caregiver suggests.   You have leaking of fluid from the vagina (birth canal). If leaking membranes are suspected, take your temperature and tell your caregiver of this when you call.   There is vaginal spotting, bleeding or passing clots. Tell your caregiver of the amount and how many pads are  used.   You develop a bad smelling vaginal discharge with a change in the color from clear to white.   You develop vomiting that lasts more than 24 hours.   You develop chills or fever.   You develop shortness of breath.   You develop burning on urination.   You loose more than 2 pounds of weight or gain more than 2 pounds of weight or as suggested by your   caregiver.   You notice sudden swelling of your face, hands, and feet or legs.   You develop belly (abdominal) pain. Round ligament discomfort is a common non-cancerous (benign) cause of abdominal pain in pregnancy. Your caregiver still must evaluate you.   You develop a severe headache that does not go away.   You develop visual problems, blurred or double vision.   If you have not felt your baby move for more than 1 hour. If you think the baby is not moving as much as usual, eat something with sugar in it and lie down on your left side for an hour. The baby should move at least 4 to 5 times per hour. Call right away if your baby moves less than that.   You fall, are in a car accident or any kind of trauma.   There is mental or physical violence at home.  Document Released: 03/21/2001 Document Revised: 03/16/2011 Document Reviewed: 09/23/2008 [employer] Patient Information 2012 [employer]. 

## 2011-06-14 ENCOUNTER — Inpatient Hospital Stay (HOSPITAL_COMMUNITY)
Admission: AD | Admit: 2011-06-14 | Discharge: 2011-06-17 | DRG: 775 | Disposition: A | Discharge status: Home / Self Care | Payer: 59 | Attending: Obstetrics & Gynecology | Admitting: Obstetrics & Gynecology

## 2011-06-14 DIAGNOSIS — O0940 Supervision of pregnancy with grand multiparity, unspecified trimester: Secondary | ICD-10-CM

## 2011-06-14 DIAGNOSIS — O09529 Supervision of elderly multigravida, unspecified trimester: Secondary | ICD-10-CM | POA: Diagnosis present

## 2011-06-14 DIAGNOSIS — IMO0002 Reserved for concepts with insufficient information to code with codable children: Secondary | ICD-10-CM | POA: Diagnosis not present

## 2011-06-14 DIAGNOSIS — O358XX0 Maternal care for other (suspected) fetal abnormality and damage, not applicable or unspecified: Secondary | ICD-10-CM

## 2011-06-14 NOTE — Progress Notes (Signed)
contractions 

## 2011-06-15 ENCOUNTER — Encounter (HOSPITAL_COMMUNITY): Payer: Self-pay | Admitting: *Deleted

## 2011-06-15 DIAGNOSIS — O09529 Supervision of elderly multigravida, unspecified trimester: Secondary | ICD-10-CM

## 2011-06-15 DIAGNOSIS — IMO0002 Reserved for concepts with insufficient information to code with codable children: Secondary | ICD-10-CM | POA: Diagnosis not present

## 2011-06-15 LAB — CBC
HCT: 34 % — ABNORMAL LOW (ref 36.0–46.0)
Hemoglobin: 10.9 g/dL — ABNORMAL LOW (ref 12.0–15.0)
MCH: 29.3 pg (ref 26.0–34.0)
MCHC: 32.1 g/dL (ref 30.0–36.0)
MCV: 91.4 fL (ref 78.0–100.0)
Platelets: 264 K/uL (ref 150–400)
RBC: 3.72 MIL/uL — ABNORMAL LOW (ref 3.87–5.11)
RDW: 15.4 % (ref 11.5–15.5)
WBC: 16.5 K/uL — ABNORMAL HIGH (ref 4.0–10.5)

## 2011-06-15 MED ORDER — PRENATAL MULTIVITAMIN CH
1.0000 | ORAL_TABLET | Freq: Every day | ORAL | Status: DC
  Administered 2011-06-15 – 2011-06-17 (×3): 1 via ORAL
  Filled 2011-06-15 (×3): qty 1

## 2011-06-15 MED ORDER — OXYCODONE-ACETAMINOPHEN 5-325 MG PO TABS: 1.0000 | ORAL_TABLET | ORAL | Status: DC | PRN

## 2011-06-15 MED ORDER — ACETAMINOPHEN 325 MG PO TABS: 650.0000 mg | ORAL_TABLET | ORAL | Status: DC | PRN

## 2011-06-15 MED ORDER — WITCH HAZEL-GLYCERIN EX PADS
1.0000 "application " | MEDICATED_PAD | CUTANEOUS | Status: DC | PRN
  Administered 2011-06-15: 1 via TOPICAL

## 2011-06-15 MED ORDER — CITRIC ACID-SODIUM CITRATE 334-500 MG/5ML PO SOLN: 30.0000 mL | ORAL | Status: DC | PRN

## 2011-06-15 MED ORDER — SIMETHICONE 80 MG PO CHEW: 80.0000 mg | CHEWABLE_TABLET | ORAL | Status: DC | PRN

## 2011-06-15 MED ORDER — DIBUCAINE 1 % RE OINT
1.0000 "application " | TOPICAL_OINTMENT | RECTAL | Status: DC | PRN
  Filled 2011-06-15: qty 28

## 2011-06-15 MED ORDER — FLEET ENEMA 7-19 GM/118ML RE ENEM: 1.0000 | ENEMA | RECTAL | Status: DC | PRN

## 2011-06-15 MED ORDER — LIDOCAINE HCL (PF) 1 % IJ SOLN: 30.0000 mL | INTRAMUSCULAR | Status: DC | PRN

## 2011-06-15 MED ORDER — IBUPROFEN 600 MG PO TABS: 600.0000 mg | ORAL_TABLET | Freq: Four times a day (QID) | ORAL | Status: DC | PRN

## 2011-06-15 MED ORDER — DIPHENHYDRAMINE HCL 25 MG PO CAPS: 25.0000 mg | ORAL_CAPSULE | Freq: Four times a day (QID) | ORAL | Status: DC | PRN

## 2011-06-15 MED ORDER — ONDANSETRON HCL 4 MG PO TABS: 4.0000 mg | ORAL_TABLET | ORAL | Status: DC | PRN

## 2011-06-15 MED ORDER — ONDANSETRON HCL 4 MG/2ML IJ SOLN: 4.0000 mg | Freq: Four times a day (QID) | INTRAMUSCULAR | Status: DC | PRN

## 2011-06-15 MED ORDER — IBUPROFEN 600 MG PO TABS
600.0000 mg | ORAL_TABLET | Freq: Four times a day (QID) | ORAL | Status: DC
  Administered 2011-06-15 – 2011-06-17 (×9): 600 mg via ORAL
  Filled 2011-06-15 (×9): qty 1

## 2011-06-15 MED ORDER — BENZOCAINE-MENTHOL 20-0.5 % EX AERO
1.0000 "application " | INHALATION_SPRAY | CUTANEOUS | Status: DC | PRN
  Filled 2011-06-15: qty 56

## 2011-06-15 MED ORDER — ZOLPIDEM TARTRATE 5 MG PO TABS: 5.0000 mg | ORAL_TABLET | Freq: Every evening | ORAL | Status: DC | PRN

## 2011-06-15 MED ORDER — ONDANSETRON HCL 4 MG/2ML IJ SOLN: 4.0000 mg | INTRAMUSCULAR | Status: DC | PRN

## 2011-06-15 MED ORDER — SENNOSIDES-DOCUSATE SODIUM 8.6-50 MG PO TABS
2.0000 | ORAL_TABLET | Freq: Every day | ORAL | Status: DC
  Administered 2011-06-15: 2 via ORAL

## 2011-06-15 MED ORDER — TETANUS-DIPHTH-ACELL PERTUSSIS 5-2.5-18.5 LF-MCG/0.5 IM SUSP: 0.5000 mL | Freq: Once | INTRAMUSCULAR | Status: DC

## 2011-06-15 MED ORDER — BENZOCAINE-MENTHOL 20-0.5 % EX AERO
INHALATION_SPRAY | CUTANEOUS | Status: AC
  Administered 2011-06-15: 04:00:00
  Filled 2011-06-15: qty 56

## 2011-06-15 MED ORDER — LANOLIN HYDROUS EX OINT: TOPICAL_OINTMENT | CUTANEOUS | Status: DC | PRN

## 2011-06-15 NOTE — H&P (Signed)
Lindsey Porter is a 36 y.o. female presenting for labor Maternal Medical History:  Reason for admission: Reason for admission: contractions.  Contractions: Onset was 1-2 hours ago.   Frequency: regular.   Perceived severity is strong.    Fetal activity: Perceived fetal activity is normal.   Last perceived fetal movement was within the past hour.    Prenatal complications: No bleeding.     OB History    Grav Para Term Preterm Abortions TAB SAB Ect Mult Living   8 7 7      1 8      Past Medical History  Diagnosis Date  . Heart murmur   . Anemia     36 yo   Past Surgical History  Procedure Date  . Wisdom tooth extraction    Family History: family history is not on file. Social History:  reports that she has never smoked. She has never used smokeless tobacco. She reports that she does not drink alcohol or use illicit drugs.  Review of Systems  Constitutional: Negative for fever.  Genitourinary: Negative for dysuria.    Dilation: 6.5 Effacement (%): 100 Station: -2 Exam by:: Baird,RN Last menstrual period 08/28/2010. Maternal Exam:  Uterine Assessment: Contraction strength is firm.  Contraction frequency is regular.   Abdomen: Fundal height is 41.   Estimated fetal weight is 8.5.   Fetal presentation: vertex  Introitus: Normal vulva. Normal vagina.  Vagina is negative for discharge.  Ferning test: not done.  Nitrazine test: not done. Amniotic fluid character: not assessed.  Pelvis: adequate for delivery.   Cervix: Cervix evaluated by digital exam.     Fetal Exam Fetal Monitor Review: Mode: ultrasound.   Baseline rate: 140.  Variability: moderate (6-25 bpm).   Pattern: accelerations present and no decelerations.    Fetal State Assessment: Category I - tracings are normal.     Physical Exam  Constitutional: She is oriented to person, place, and time. She appears well-developed and well-nourished.  HENT:  Head: Normocephalic.  Cardiovascular: Normal  rate.   Respiratory: Effort normal.  GI: Soft. She exhibits no distension and no mass. There is no tenderness. There is no rebound and no guarding.  Genitourinary: Vagina normal and uterus normal. No vaginal discharge found.  Musculoskeletal: Normal range of motion.  Neurological: She is alert and oriented to person, place, and time.  Skin: Skin is warm and dry.  Psychiatric: She has a normal mood and affect.    Prenatal labs: ABO, Rh: A/POS/-- (10/05 1144) Antibody: NEG (10/05 1144) Rubella: 410.9 (10/05 1144) RPR: NON REAC (12/12 1155)  HBsAg: NEGATIVE (10/05 1144)  HIV: NON REACTIVE (12/12 1155)  GBS:     Assessment/Plan: A:  SIUP at [redacted]w[redacted]d      Active Labor P:  Admit to BS      Routine orders      Desires noninterventive birth  [facility] 06/15/2011, 12:22 AM

## 2011-06-15 NOTE — Progress Notes (Signed)
Pt may go to room 169. 

## 2011-06-15 NOTE — Progress Notes (Signed)
Pt wishes to ambulate to room.  Taken to room 169 with CNM at side.

## 2011-06-16 NOTE — Progress Notes (Signed)
Post Partum Day 1 Subjective: no complaints, up ad lib, voiding, tolerating PO and + flatus  Objective: Blood pressure 109/69, pulse 69, temperature 97.3 F (36.3 C), temperature source Oral, resp. rate 20, last menstrual period 08/28/2010, SpO2 100.00%, unknown if currently breastfeeding.  Physical Exam:  General: alert and no distress Lochia: appropriate Uterine Fundus: firm DVT Evaluation: No evidence of DVT seen on physical exam.   Basename 06/15/11 0115  HGB 10.9*  HCT 34.0*    Assessment/Plan: Plan for discharge tomorrow, Breastfeeding, Circumcision prior to discharge and Contraception is NFP/Condoms  Patient desires circumcision for her female infant.  Circumcision procedure details discussed, risks and benefits of procedure were also discussed.  These include but are not limited to: Benefits of circumcision in men include reduction in the rates of urinary tract infection (UTI), penile cancer, some sexually transmitted infections, penile inflammatory and retractile disorders, as well as easier hygiene.  Risks include bleeding , infection, injury of glans which may lead to penile deformity or urinary tract issues, unsatisfactory cosmetic appearance and other potential complications related to the procedure.  It was emphasized that this is an elective procedure.  Patient wants to proceed with circumcision; written informed consent obtained.  Will do circumcision soon, routine circumcision and post circumcision care ordered for the infant.  Will continue routine postpartum care for patient, plan for discharge either later today or tomorrow.   LOS: 2 days   Lilymae Swiech A 06/16/2011, 10:01 AM

## 2011-06-17 MED ORDER — IBUPROFEN 600 MG PO TABS: 600.0000 mg | ORAL_TABLET | Freq: Four times a day (QID) | ORAL | Status: AC | PRN

## 2011-06-17 NOTE — Discharge Summary (Signed)
Obstetric Discharge Summary Reason for Admission: onset of labor Prenatal Procedures: NST and ultrasound Intrapartum Procedures: spontaneous vaginal delivery and 1 min shoulder dystocia Postpartum Procedures: none Complications-Operative and Postpartum: Shoulder dystocia, 2 maneuvers, 1 minutes Hemoglobin  Date Value Range Status  06/15/2011 10.9* 12.0-15.0 (g/dL) Final     HCT  Date Value Range Status  06/15/2011 34.0* 36.0-46.0 (%) Final    Discharge Diagnoses: Term Pregnancy-delivered  Discharge Information: Date: 06/17/2011 Activity: pelvic rest Diet: routine Medications: Ibuprofen Condition: stable Instructions: refer to practice specific booklet Discharge to: home Follow-up Information    Follow up with [facility]. Schedule an appointment as soon as possible for a visit in 5 weeks.   Contact information:   [address] [place] [address]          Newborn Data: Live born female  Birth Weight: 9 lb 1.5 oz (4125 g) APGAR: 8, 9  Home with mother.  Lindsey Porter E. 06/17/2011, 6:46 AM

## 2011-06-17 NOTE — Progress Notes (Signed)
Post Partum Day 2   Subjective: no complaints  Objective: Blood pressure 123/77, pulse 83, temperature 97.8 F (36.6 C), temperature source Oral, resp. rate 18, last menstrual period 08/28/2010, SpO2 98.00%, unknown if currently breastfeeding.  Physical Exam:  General: alert and no distress Lochia: appropriate Uterine Fundus: firm Incision: n/a DVT Evaluation: No evidence of DVT seen on physical exam.   Basename 06/15/11 0115  HGB 10.9*  HCT 34.0*    Assessment/Plan: Discharge home, Breastfeeding and Contraception Condoms & Natural Family Planning FU [place] 4-6 weeks   LOS: 3 days   Victorious Kundinger E. 06/17/2011, 6:43 AM

## 2011-06-17 NOTE — Discharge Instructions (Signed)

## 2011-06-18 NOTE — Discharge Summary (Signed)
Discharge documentation and planning reviewed.

## 2011-07-28 ENCOUNTER — Ambulatory Visit (INDEPENDENT_AMBULATORY_CARE_PROVIDER_SITE_OTHER): Payer: 59 | Admitting: Family

## 2011-07-28 ENCOUNTER — Encounter: Payer: Self-pay | Admitting: Family

## 2011-07-28 NOTE — Progress Notes (Signed)
  Subjective:     Lindsey Porter is a 36 y.o. female who presents for a postpartum visit. She is 6 weeks postpartum following a spontaneous vaginal delivery. I have fully reviewed the prenatal and intrapartum course. The delivery was at 41 gestational weeks. Outcome: spontaneous vaginal delivery. Anesthesia: none. Postpartum course has been unremarkable. Baby's course has been unremarkable. Baby is feeding by breast. Bleeding spotting. Bowel function is normal. Bladder function is normal. Patient is sexually active. Contraception method is condoms. Postpartum depression screening: negative.  The following portions of the patient's history were reviewed and updated as appropriate: allergies, current medications, past family history, past medical history, past social history, past surgical history and problem list.  Review of Systems Pertinent items are noted in HPI.   Objective:    BP 109/68  Pulse 61  Temp(Src) 98.2 F (36.8 C) (Oral)  Resp 16  Ht 5\' 5"  (1.651 m)  Wt 160 lb (72.576 kg)  BMI 26.63 kg/m2  Breastfeeding? Yes  General:  alert, cooperative and appears stated age   Breasts:  negative  Lungs: clear to auscultation bilaterally  Heart:  regular rate and rhythm, S1, S2 normal, no murmur, click, rub or gallop  Abdomen: soft, non-tender; bowel sounds normal; no masses,  no organomegaly   Vulva:  not examined  Vagina: not evaluated  Cervix:  not examined  Corpus: not examined  Adnexa:  normal adnexa  Rectal Exam: Not performed.        Assessment:    Normal postpartum exam. Pap smear not done at today's visit.   Plan:    1. Contraception: condoms 2. Follow up in: 6 months for well woman exam.

## 2012-02-23 IMAGING — US US OB FOLLOW-UP
1 series · 12 of 28 positions shown · non-contrast
Comparison: none

[Series 1: us ob follow up · 42 acquisitions, 12 frames shown]
[im 2/42]
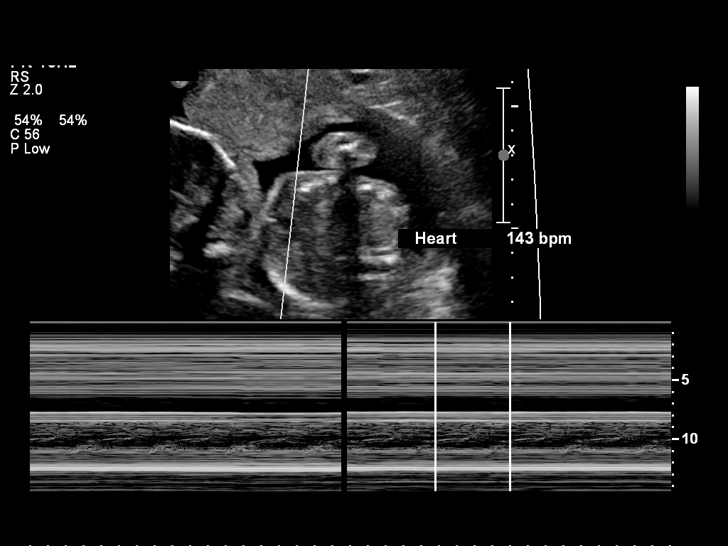
[im 5/42]
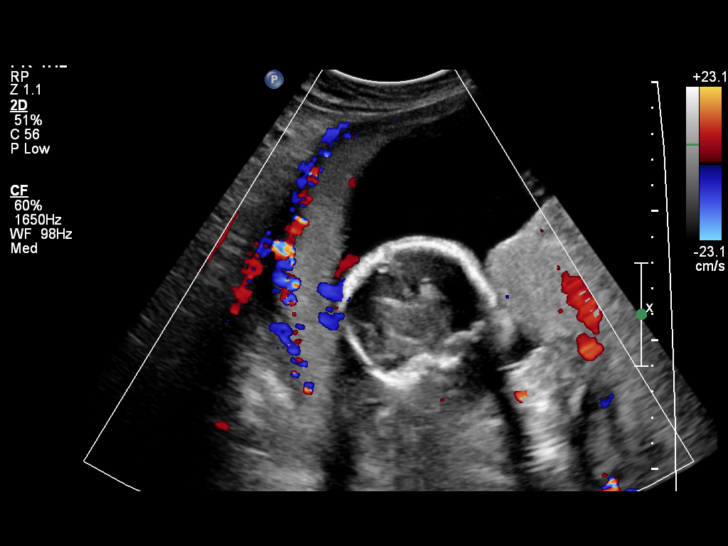
[im 8/42]
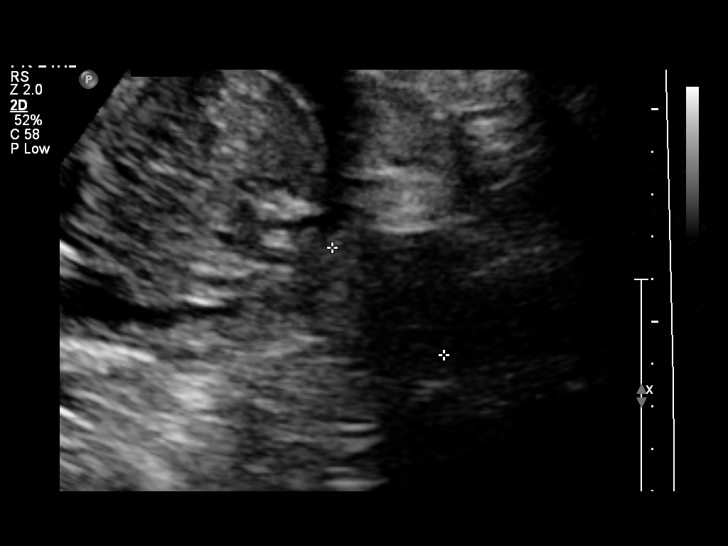
[im 13/42]
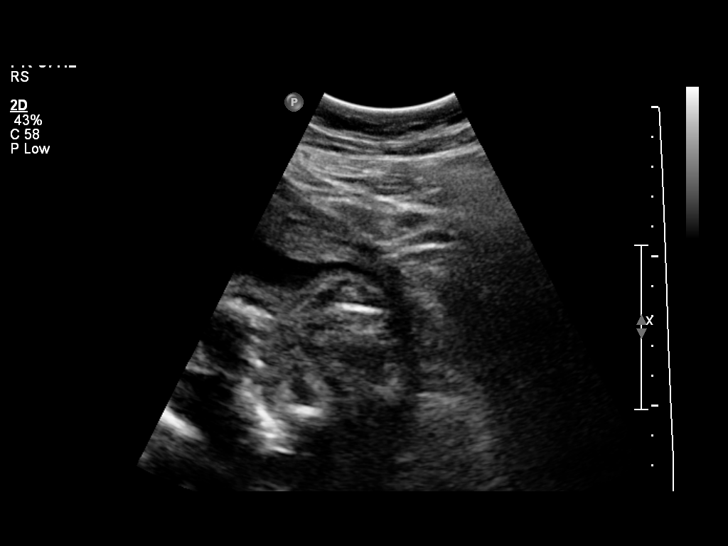
[im 16/42]
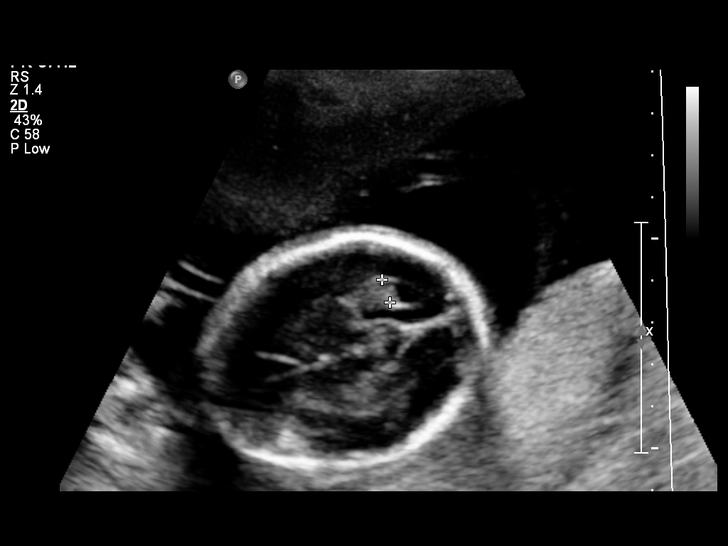
[im 19/42]
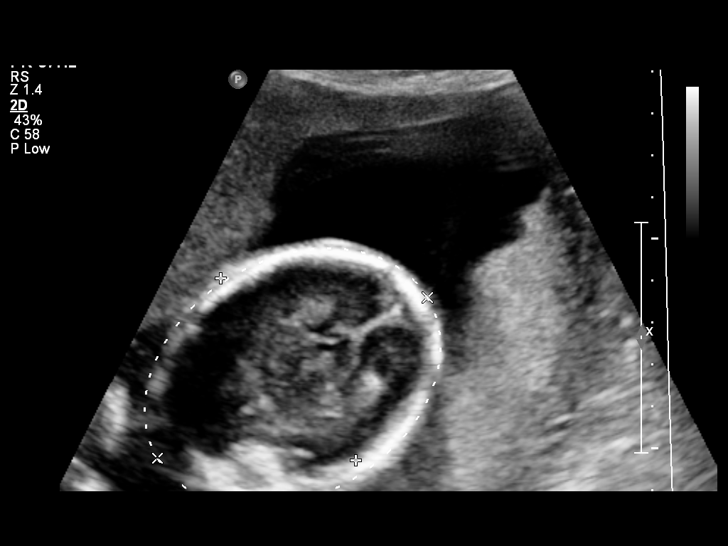
[im 23/42]
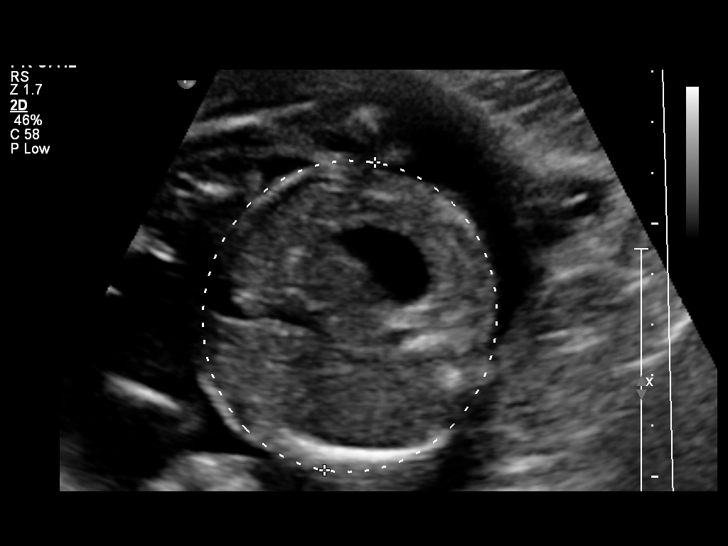
[im 26/42]
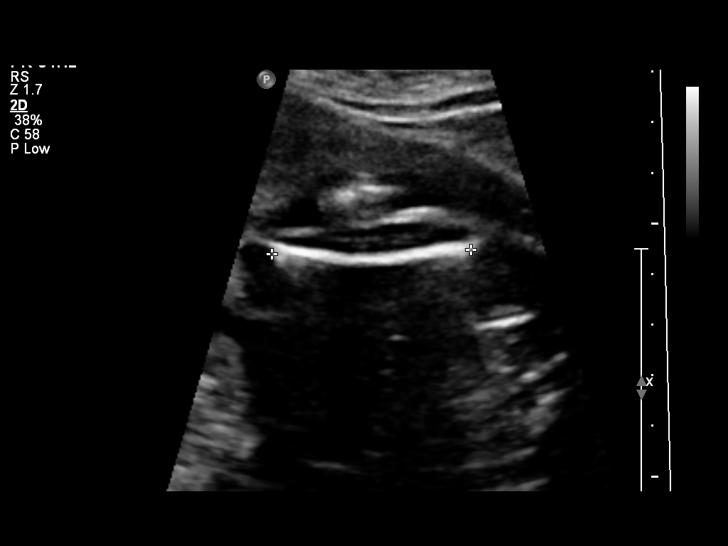
[im 29/42]
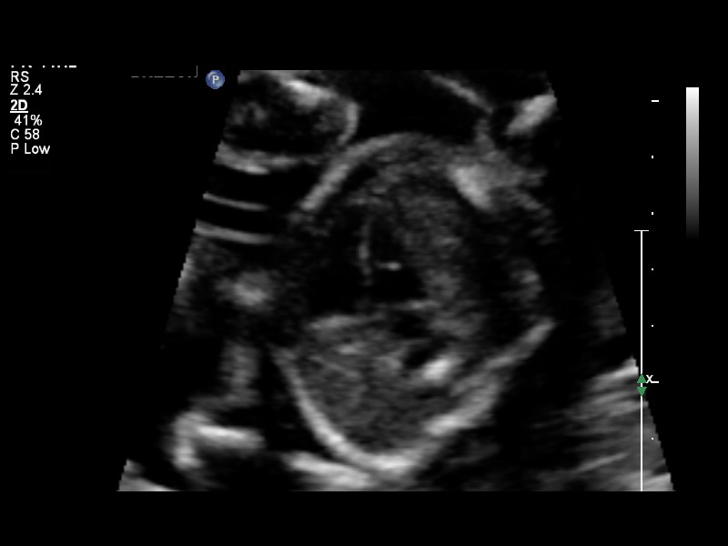
[im 34/42]
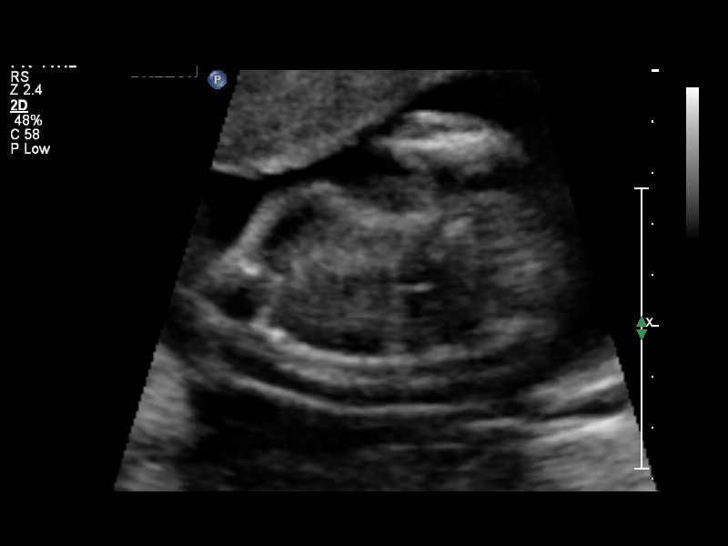
[im 37/42]
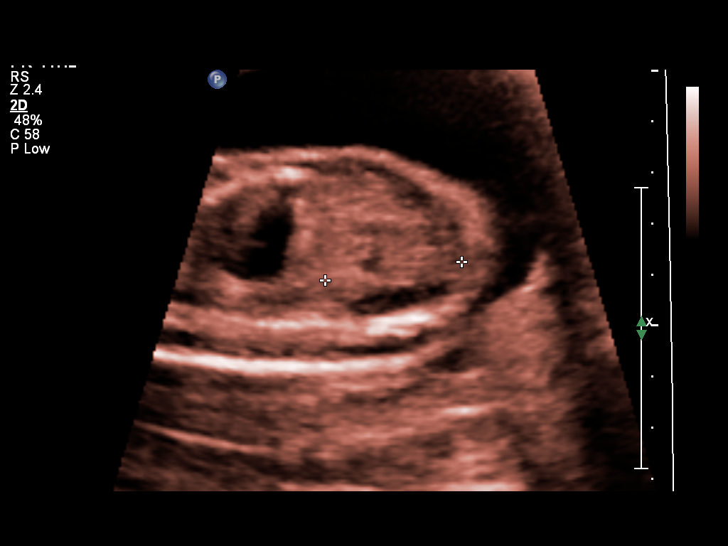
[im 40/42]
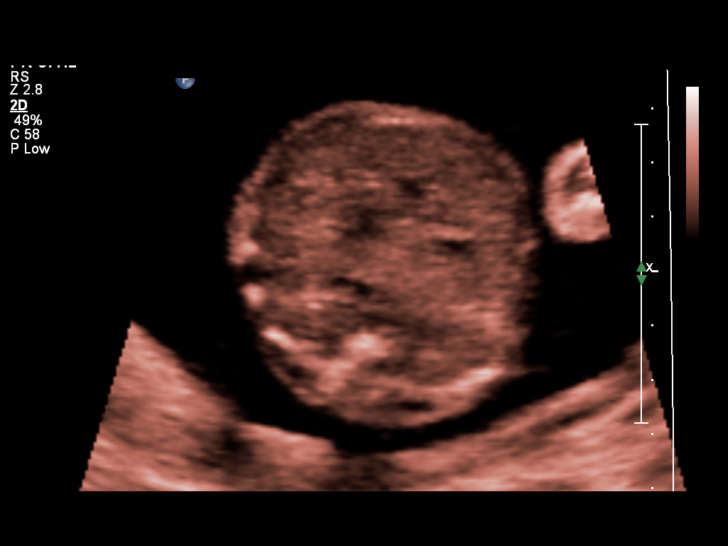

[12 of 28 positions shown; findings below may reference images not displayed]

OBSTETRICS REPORT
                      (Signed Final 02/09/2011 [DATE])

 Order#:         [id number]
Procedures

 US OB FOLLOW UP                                       76816.1
Indications

 Echogenic focus in heart (ECF) (LV)
 Advanced maternal age (AMA), Multigravida
Fetal Evaluation

 Fetal Heart Rate:  143                          bpm
 Cardiac Activity:  Observed
 Presentation:      Frank breech
 Placenta:          Posterior Fundal, above
                    cervical os
 P. Cord Insertion: Visualized

 Amniotic Fluid
 AFI FV:      Subjectively within normal limits
                                              Larg Pckt:    5.6  cm
Biometry

 BPD:       54  mm     G. Age:  22w 3d                CI:        70.87   70 - 86
                                                      FL/HC:       19.5  18.7 -

 HC:     204.4  mm     G. Age:  22w 4d        7  %    HC/AC:       1.07  1.05 -

 AC:     191.5  mm     G. Age:  23w 6d       51  %    FL/BPD:      73.9  71 - 87
 FL:      39.9  mm     G. Age:  22w 6d       19  %    FL/AC:       20.8  20 - 24

 Est. FW:     582  gm      1 lb 5 oz     47  %
Gestational Age

 LMP:           23w 4d        Date:  08/28/10                 EDD:   06/04/11
 U/S Today:     23w 0d                                        EDD:   06/08/11
 Best:          23w 4d     Det. By:  LMP  (08/28/10)          EDD:   06/04/11
Anatomy

 Cranium:           Appears normal      Aortic Arch:       Previously seen
 Fetal Cavum:       Appears normal      Ductal Arch:       Previously seen
 Ventricles:        Appears normal      Diaphragm:         Appears normal
 Choroid Plexus:    Appears normal      Stomach:           Appears normal,
                                                           left sided
 Cerebellum:        Previously seen     Abdomen:           Previously seen
 Posterior Fossa:   Previously seen     Abdominal Wall:    Previously seen
 Nuchal Fold:       Not applicable      Cord Vessels:      Previously seen
                    (>20 wks GA)
 Face:              Lips and profile    Kidneys:           Appear normal
                    previously seen
 Heart:             Echogenic           Bladder:           Appears normal
                    focus in LV
 RVOT:              Previously seen     Spine:             Previously seen
 LVOT:              Previously seen     Limbs:             Previously seen

 Other:     Parents do not wish to know sex of fetus.  Technically
            difficult due to fetal position.
Cervix Uterus Adnexa

 Cervical Length:    3.63      cm

 Cervix:       Normal appearance by transabdominal scan.

 Adnexa:     No abnormality visualized.
Impression

 Single intrauterine gestation demonstrating an estimated
 gestational age by ultrasound of 23w 0d. This is correlated
 with expected estimated gestational age by LMP of 23w 4d.
 Appropriate growth is noted.

 An echogenic intracardiac focus is again noted. No late
 developing fetal anatomic abnormalities are noted associated
 with the lateral ventricles, four chamber heart, stomach,
 kidneys or bladder.

 Subjectively and quantitatively normal amniotic fluid volume.

 Normal cervical length and appearance.

 questions or concerns.

## 2012-10-07 ENCOUNTER — Ambulatory Visit (INDEPENDENT_AMBULATORY_CARE_PROVIDER_SITE_OTHER): Payer: 59 | Admitting: Family

## 2012-10-07 ENCOUNTER — Telehealth: Payer: Self-pay | Admitting: *Deleted

## 2012-10-07 ENCOUNTER — Encounter: Payer: Self-pay | Admitting: Family

## 2012-10-07 VITALS — BP 127/78 | Wt 168.0 lb

## 2012-10-07 DIAGNOSIS — Z3491 Encounter for supervision of normal pregnancy, unspecified, first trimester: Secondary | ICD-10-CM

## 2012-10-07 DIAGNOSIS — Z124 Encounter for screening for malignant neoplasm of cervix: Secondary | ICD-10-CM

## 2012-10-07 DIAGNOSIS — Z113 Encounter for screening for infections with a predominantly sexual mode of transmission: Secondary | ICD-10-CM

## 2012-10-07 DIAGNOSIS — Z3480 Encounter for supervision of other normal pregnancy, unspecified trimester: Secondary | ICD-10-CM

## 2012-10-07 NOTE — Telephone Encounter (Signed)
orders

## 2012-10-07 NOTE — Progress Notes (Signed)
[id number]  

## 2012-10-07 NOTE — Addendum Note (Signed)
Addended by: Arne Cleveland on: 10/07/2012 12:55 PM   Modules accepted: Orders

## 2012-10-07 NOTE — Progress Notes (Signed)
New OB visit;  discussed genetic screening, declines.  Currently breastfeeding last child.  Obtain OB labs/pap today.   Exam   BP 127/78  Wt 168 lb (76.204 kg)  BMI 27.96 kg/m2  LMP 08/04/2012 Uterine Size: size equals dates  Pelvic Exam:    Perineum: No Hemorrhoids, Normal Perineum   Vulva: normal   Vagina:  normal mucosa, normal discharge, no palpable nodules   pH: Not done   Cervix: no bleeding following Pap, no cervical motion tenderness and no lesions   Adnexa: normal adnexa and no mass, fullness, tenderness   Bony Pelvis: Adequate  System: Breast:  No nipple retraction or dimpling, No nipple discharge or bleeding, No axillary or supraclavicular adenopathy, Normal to palpation without dominant masses   Skin: normal coloration and turgor, no rashes    Neurologic: negative   Extremities: normal strength, tone, and muscle mass   HEENT neck supple with midline trachea and thyroid without masses   Mouth/Teeth mucous membranes moist, pharynx normal without lesions   Neck supple and no masses   Cardiovascular: regular rate and rhythm, no murmurs or gallops   Respiratory:  appears well, vitals normal, no respiratory distress, acyanotic, normal RR, neck free of mass or lymphadenopathy, chest clear, no wheezing, crepitations, rhonchi, normal symmetric air entry   Abdomen: soft, non-tender; bowel sounds normal; no masses,  no organomegaly   Urinary: urethral meatus normal

## 2012-10-08 LAB — OB PANEL WITH HIV
Eosinophils Relative: 0 % (ref 0–5)
HCT: 36.3 % (ref 36.0–46.0)
Hemoglobin: 12.4 g/dL (ref 12.0–15.0)
Lymphocytes Relative: 17 % (ref 12–46)
Lymphs Abs: 1.4 10*3/uL (ref 0.7–4.0)
MCH: 31.2 pg (ref 26.0–34.0)
MCV: 91.2 fL (ref 78.0–100.0)
Monocytes Absolute: 0.4 10*3/uL (ref 0.1–1.0)
Monocytes Relative: 5 % (ref 3–12)
RBC: 3.98 MIL/uL (ref 3.87–5.11)
Rh Type: POSITIVE
Rubella: 18.3 Index — ABNORMAL HIGH (ref <0.90)
WBC: 8 10*3/uL (ref 4.0–10.5)

## 2012-10-09 ENCOUNTER — Encounter: Payer: Self-pay | Admitting: Family

## 2012-10-09 LAB — CULTURE, URINE COMPREHENSIVE: Colony Count: 65000

## 2012-11-04 ENCOUNTER — Ambulatory Visit (INDEPENDENT_AMBULATORY_CARE_PROVIDER_SITE_OTHER): Payer: 59 | Admitting: Advanced Practice Midwife

## 2012-11-04 VITALS — BP 126/72 | Wt 170.0 lb

## 2012-11-04 DIAGNOSIS — IMO0002 Reserved for concepts with insufficient information to code with codable children: Secondary | ICD-10-CM

## 2012-11-04 DIAGNOSIS — Z3480 Encounter for supervision of other normal pregnancy, unspecified trimester: Secondary | ICD-10-CM

## 2012-11-04 NOTE — Patient Instructions (Signed)
Pregnancy - Second Trimester The second trimester is the period between 13 to 27 weeks of your pregnancy. It is important to follow your doctor's instructions. HOME CARE   Do not smoke.  Do not drink alcohol or use drugs.  Only take medicine as told by your doctor.  Take prenatal vitamins as told. The vitamin should contain 1 milligram of folic acid.  Exercise.  Eat healthy foods. Eat regular, well-balanced meals.  You can have sex (intercourse) if there are no other problems with the pregnancy.  Do not use hot tubs, steam rooms, or saunas.  Wear a seat belt while driving.  Avoid raw meat, uncooked cheese, and litter boxes and soil used by cats.  Visit your dentist. Cleanings are okay. GET HELP RIGHT AWAY IF:   You have a temperature by mouth above 102 F (38.9 C), not controlled by medicine.  Fluid is coming from your vagina.  Blood is coming from your vagina. Light spotting is common, especially after sex (intercourse).  You have a bad smelling fluid (discharge) coming from the vagina. The fluid changes from clear to white.  You still feel sick to your stomach (nauseous).  You throw up (vomit) blood.  You lose or gain more than 2 pounds (0.9 kilograms) of weight in a week, or as suggested by your doctor.  Your face, hands, feet, or legs get puffy (swell).  You get exposed to German measles and have never had them.  You get exposed to fifth disease or chickenpox.  You have belly (abdominal) pain.  You have a bad headache that will not go away.  You have watery poop (diarrhea), pain when you pee (urinate), or have shortness of breath.  You start to have problems seeing (blurry or double vision).  You fall, are in a car accident, or have any kind of trauma.  There is mental or physical violence at home.  You have any concerns or worries during your pregnancy. MAKE SURE YOU:   Understand these instructions.  Will watch your condition.  Will get help  right away if you are not doing well or get worse. Document Released: 06/21/2009 Document Revised: 06/19/2011 Document Reviewed: 06/21/2009 [employer] Patient Information 2014 [employer].  

## 2012-11-04 NOTE — Progress Notes (Signed)
p-79 

## 2012-11-04 NOTE — Progress Notes (Signed)
Reviewed labs normal.  Declines genetic screening, recommend early glucola due to LGA last baby. Pt agrees

## 2012-12-02 ENCOUNTER — Ambulatory Visit (INDEPENDENT_AMBULATORY_CARE_PROVIDER_SITE_OTHER): Payer: Self-pay | Admitting: Advanced Practice Midwife

## 2012-12-02 VITALS — BP 141/82 | Wt 175.0 lb

## 2012-12-02 DIAGNOSIS — Z3480 Encounter for supervision of other normal pregnancy, unspecified trimester: Secondary | ICD-10-CM

## 2012-12-02 DIAGNOSIS — O09529 Supervision of elderly multigravida, unspecified trimester: Secondary | ICD-10-CM

## 2012-12-02 DIAGNOSIS — O09522 Supervision of elderly multigravida, second trimester: Secondary | ICD-10-CM

## 2012-12-02 NOTE — Progress Notes (Signed)
p=96 

## 2012-12-02 NOTE — Progress Notes (Signed)
Doing well.  Feeling some flutters, denies vaginal bleeding, LOF, cramping/contractions.

## 2012-12-05 ENCOUNTER — Telehealth: Payer: Self-pay | Admitting: *Deleted

## 2012-12-05 NOTE — Telephone Encounter (Signed)
Message copied by Granville Lewis on Thu Dec 05, 2012  9:54 AM ------      Message from: LEFTWICH-KIRBY, LISA A      Created: Mon Dec 02, 2012  1:06 PM      Regarding: Pt needs GTT      Contact: [phone]       Mervyn Gay,            I saw this pt today and then I saw the note that an early GTT is recommended b/c her last baby was LGA.  Could you call her and have her come in next week for 1 hour screen?  Thanks so much:) ------

## 2012-12-05 NOTE — Telephone Encounter (Signed)
LM on voicemail that she needs to call office to schedule an early 1 ht GTT due to size of previous babies.

## 2012-12-18 ENCOUNTER — Other Ambulatory Visit: Payer: 59

## 2012-12-18 LAB — CBC
HCT: 32.2 % — ABNORMAL LOW (ref 36.0–46.0)
Hemoglobin: 10.9 g/dL — ABNORMAL LOW (ref 12.0–15.0)
MCHC: 33.9 g/dL (ref 30.0–36.0)
MCV: 95.8 fL (ref 78.0–100.0)

## 2012-12-18 NOTE — Progress Notes (Signed)
Pt here for early 1hr GTT due to previous LGA

## 2012-12-19 ENCOUNTER — Encounter: Payer: Self-pay | Admitting: Obstetrics & Gynecology

## 2012-12-19 LAB — GLUCOSE TOLERANCE, 1 HOUR (50G) W/O FASTING: Glucose, 1 Hour GTT: 91 mg/dL (ref 70–140)

## 2012-12-20 ENCOUNTER — Ambulatory Visit (HOSPITAL_COMMUNITY)
Admission: RE | Admit: 2012-12-20 | Discharge: 2012-12-20 | Disposition: A | Discharge status: Home / Self Care | Payer: 59 | Source: Ambulatory Visit | Attending: Advanced Practice Midwife | Admitting: Advanced Practice Midwife

## 2012-12-20 ENCOUNTER — Ambulatory Visit (HOSPITAL_COMMUNITY): Admission: RE | Admit: 2012-12-20 | Payer: 59 | Source: Ambulatory Visit

## 2012-12-20 DIAGNOSIS — O09522 Supervision of elderly multigravida, second trimester: Secondary | ICD-10-CM

## 2012-12-20 DIAGNOSIS — Z363 Encounter for antenatal screening for malformations: Secondary | ICD-10-CM | POA: Insufficient documentation

## 2012-12-20 DIAGNOSIS — Z1389 Encounter for screening for other disorder: Secondary | ICD-10-CM | POA: Insufficient documentation

## 2012-12-20 DIAGNOSIS — O358XX0 Maternal care for other (suspected) fetal abnormality and damage, not applicable or unspecified: Secondary | ICD-10-CM | POA: Insufficient documentation

## 2012-12-20 DIAGNOSIS — O09529 Supervision of elderly multigravida, unspecified trimester: Secondary | ICD-10-CM | POA: Insufficient documentation

## 2012-12-30 ENCOUNTER — Ambulatory Visit (INDEPENDENT_AMBULATORY_CARE_PROVIDER_SITE_OTHER): Payer: 59 | Admitting: Obstetrics and Gynecology

## 2012-12-30 DIAGNOSIS — Z641 Problems related to multiparity: Secondary | ICD-10-CM | POA: Insufficient documentation

## 2012-12-30 DIAGNOSIS — Z3480 Encounter for supervision of other normal pregnancy, unspecified trimester: Secondary | ICD-10-CM

## 2012-12-30 NOTE — Patient Instructions (Signed)
Pregnancy - Second Trimester The second trimester of pregnancy (3 to 6 months) is a period of rapid growth for you and your baby. At the end of the sixth month, your baby is about 9 inches long and weighs 1 1/2 pounds. You will begin to feel the baby move between 18 and 20 weeks of the pregnancy. This is called quickening. Weight gain is faster. A clear fluid (colostrum) may leak out of your breasts. You may feel small contractions of the womb (uterus). This is known as false labor or Braxton-Hicks contractions. This is like a practice for labor when the baby is ready to be born. Usually, the problems with morning sickness have usually passed by the end of your first trimester. Some women develop small dark blotches (called cholasma, mask of pregnancy) on their face that usually goes away after the baby is born. Exposure to the sun makes the blotches worse. Acne may also develop in some pregnant women and pregnant women who have acne, may find that it goes away. PRENATAL EXAMS  Blood work may continue to be done during prenatal exams. These tests are done to check on your health and the probable health of your baby. Blood work is used to follow your blood levels (hemoglobin). Anemia (low hemoglobin) is common during pregnancy. Iron and vitamins are given to help prevent this. You will also be checked for diabetes between 24 and 28 weeks of the pregnancy. Some of the previous blood tests may be repeated.  The size of the uterus is measured during each visit. This is to make sure that the baby is continuing to grow properly according to the dates of the pregnancy.  Your blood pressure is checked every prenatal visit. This is to make sure you are not getting toxemia.  Your urine is checked to make sure you do not have an infection, diabetes or protein in the urine.  Your weight is checked often to make sure gains are happening at the suggested rate. This is to ensure that both you and your baby are  growing normally.  Sometimes, an ultrasound is performed to confirm the proper growth and development of the baby. This is a test which bounces harmless sound waves off the baby so your caregiver can more accurately determine due dates. Sometimes, a test is done on the amniotic fluid surrounding the baby. This test is called an amniocentesis. The amniotic fluid is obtained by sticking a needle into the belly (abdomen). This is done to check the chromosomes in instances where there is a concern about possible genetic problems with the baby. It is also sometimes done near the end of pregnancy if an early delivery is required. In this case, it is done to help make sure the baby's lungs are mature enough for the baby to live outside of the womb. CHANGES OCCURING IN THE SECOND TRIMESTER OF PREGNANCY Your body goes through many changes during pregnancy. They vary from person to person. Talk to your caregiver about changes you notice that you are concerned about.  During the second trimester, you will likely have an increase in your appetite. It is normal to have cravings for certain foods. This varies from person to person and pregnancy to pregnancy.  Your lower abdomen will begin to bulge.  You may have to urinate more often because the uterus and baby are pressing on your bladder. It is also common to get more bladder infections during pregnancy. You can help this by drinking lots of fluids   and emptying your bladder before and after intercourse.  You may begin to get stretch marks on your hips, abdomen, and breasts. These are normal changes in the body during pregnancy. There are no exercises or medicines to take that prevent this change.  You may begin to develop swollen and bulging veins (varicose veins) in your legs. Wearing support hose, elevating your feet for 15 minutes, 3 to 4 times a day and limiting salt in your diet helps lessen the problem.  Heartburn may develop as the uterus grows and  pushes up against the stomach. Antacids recommended by your caregiver helps with this problem. Also, eating smaller meals 4 to 5 times a day helps.  Constipation can be treated with a stool softener or adding bulk to your diet. Drinking lots of fluids, and eating vegetables, fruits, and whole grains are helpful.  Exercising is also helpful. If you have been very active up until your pregnancy, most of these activities can be continued during your pregnancy. If you have been less active, it is helpful to start an exercise program such as walking.  Hemorrhoids may develop at the end of the second trimester. Warm sitz baths and hemorrhoid cream recommended by your caregiver helps hemorrhoid problems.  Backaches may develop during this time of your pregnancy. Avoid heavy lifting, wear low heal shoes, and practice good posture to help with backache problems.  Some pregnant women develop tingling and numbness of their hand and fingers because of swelling and tightening of ligaments in the wrist (carpel tunnel syndrome). This goes away after the baby is born.  As your breasts enlarge, you may have to get a bigger bra. Get a comfortable, cotton, support bra. Do not get a nursing bra until the last month of the pregnancy if you will be nursing the baby.  You may get a dark line from your belly button to the pubic area called the linea nigra.  You may develop rosy cheeks because of increase blood flow to the face.  You may develop spider looking lines of the face, neck, arms, and chest. These go away after the baby is born. HOME CARE INSTRUCTIONS   It is extremely important to avoid all smoking, herbs, alcohol, and unprescribed drugs during your pregnancy. These chemicals affect the formation and growth of the baby. Avoid these chemicals throughout the pregnancy to ensure the delivery of a healthy infant.  Most of your home care instructions are the same as suggested for the first trimester of your  pregnancy. Keep your caregiver's appointments. Follow your caregiver's instructions regarding medicine use, exercise, and diet.  During pregnancy, you are providing food for you and your baby. Continue to eat regular, well-balanced meals. Choose foods such as meat, fish, milk and other low fat dairy products, vegetables, fruits, and whole-grain breads and cereals. Your caregiver will tell you of the ideal weight gain.  A physical sexual relationship may be continued up until near the end of pregnancy if there are no other problems. Problems could include early (premature) leaking of amniotic fluid from the membranes, vaginal bleeding, abdominal pain, or other medical or pregnancy problems.  Exercise regularly if there are no restrictions. Check with your caregiver if you are unsure of the safety of some of your exercises. The greatest weight gain will occur in the last 2 trimesters of pregnancy. Exercise will help you:  Control your weight.  Get you in shape for labor and delivery.  Lose weight after you have the baby.  Wear   a good support or jogging bra for breast tenderness during pregnancy. This may help if worn during sleep. Pads or tissues may be used in the bra if you are leaking colostrum.  Do not use hot tubs, steam rooms or saunas throughout the pregnancy.  Wear your seat belt at all times when driving. This protects you and your baby if you are in an accident.  Avoid raw meat, uncooked cheese, cat litter boxes, and soil used by cats. These carry germs that can cause birth defects in the baby.  The second trimester is also a good time to visit your dentist for your dental health if this has not been done yet. Getting your teeth cleaned is okay. Use a soft toothbrush. Brush gently during pregnancy.  It is easier to leak urine during pregnancy. Tightening up and strengthening the pelvic muscles will help with this problem. Practice stopping your urination while you are going to the  bathroom. These are the same muscles you need to strengthen. It is also the muscles you would use as if you were trying to stop from passing gas. You can practice tightening these muscles up 10 times a set and repeating this about 3 times per day. Once you know what muscles to tighten up, do not perform these exercises during urination. It is more likely to contribute to an infection by backing up the urine.  Ask for help if you have financial, counseling, or nutritional needs during pregnancy. Your caregiver will be able to offer counseling for these needs as well as refer you for other special needs.  Your skin may become oily. If so, wash your face with mild soap, use non-greasy moisturizer and oil or cream based makeup. MEDICINES AND DRUG USE IN PREGNANCY  Take prenatal vitamins as directed. The vitamin should contain 1 milligram of folic acid. Keep all vitamins out of reach of children. Only a couple vitamins or tablets containing iron may be fatal to a baby or young child when ingested.  Avoid use of all medicines, including herbs, over-the-counter medicines, not prescribed or suggested by your caregiver. Only take over-the-counter or prescription medicines for pain, discomfort, or fever as directed by your caregiver. Do not use aspirin.  Let your caregiver also know about herbs you may be using.  Alcohol is related to a number of birth defects. This includes fetal alcohol syndrome. All alcohol, in any form, should be avoided completely. Smoking will cause low birth rate and premature babies.  Street or illegal drugs are very harmful to the baby. They are absolutely forbidden. A baby born to an addicted mother will be addicted at birth. The baby will go through the same withdrawal an adult does. SEEK MEDICAL CARE IF:  You have any concerns or worries during your pregnancy. It is better to call with your questions if you feel they cannot wait, rather than worry about them. SEEK IMMEDIATE  MEDICAL CARE IF:   An unexplained oral temperature above 102 F (38.9 C) develops, or as your caregiver suggests.  You have leaking of fluid from the vagina (birth canal). If leaking membranes are suspected, take your temperature and tell your caregiver of this when you call.  There is vaginal spotting, bleeding, or passing clots. Tell your caregiver of the amount and how many pads are used. Light spotting in pregnancy is common, especially following intercourse.  You develop a bad smelling vaginal discharge with a change in the color from clear to white.  You continue to feel   sick to your stomach (nauseated) and have no relief from remedies suggested. You vomit blood or coffee ground-like materials.  You lose more than 2 pounds of weight or gain more than 2 pounds of weight over 1 week, or as suggested by your caregiver.  You notice swelling of your face, hands, feet, or legs.  You get exposed to German measles and have never had them.  You are exposed to fifth disease or chickenpox.  You develop belly (abdominal) pain. Round ligament discomfort is a common non-cancerous (benign) cause of abdominal pain in pregnancy. Your caregiver still must evaluate you.  You develop a bad headache that does not go away.  You develop fever, diarrhea, pain with urination, or shortness of breath.  You develop visual problems, blurry, or double vision.  You fall or are in a car accident or any kind of trauma.  There is mental or physical violence at home. Document Released: 03/21/2001 Document Revised: 12/20/2011 Document Reviewed: 09/23/2008 [employer] Patient Information 2014 [employer].  

## 2012-12-30 NOTE — Progress Notes (Signed)
p-80 

## 2012-12-30 NOTE — Progress Notes (Signed)
Doing well. Some FM. Early glucola 91. F/U anatomy scheduled. Reviewed risks.

## 2013-01-17 ENCOUNTER — Other Ambulatory Visit: Payer: Self-pay | Admitting: Obstetrics and Gynecology

## 2013-01-17 ENCOUNTER — Ambulatory Visit (HOSPITAL_COMMUNITY): Payer: 59

## 2013-01-17 ENCOUNTER — Ambulatory Visit (HOSPITAL_COMMUNITY)
Admission: RE | Admit: 2013-01-17 | Discharge: 2013-01-17 | Disposition: A | Discharge status: Home / Self Care | Payer: 59 | Source: Ambulatory Visit | Attending: Obstetrics and Gynecology | Admitting: Obstetrics and Gynecology

## 2013-01-17 DIAGNOSIS — Z3689 Encounter for other specified antenatal screening: Secondary | ICD-10-CM | POA: Insufficient documentation

## 2013-01-17 DIAGNOSIS — Z3491 Encounter for supervision of normal pregnancy, unspecified, first trimester: Secondary | ICD-10-CM

## 2013-01-27 ENCOUNTER — Ambulatory Visit (INDEPENDENT_AMBULATORY_CARE_PROVIDER_SITE_OTHER): Payer: 59 | Admitting: Advanced Practice Midwife

## 2013-01-27 VITALS — BP 128/71 | Wt 176.0 lb

## 2013-01-27 DIAGNOSIS — Z3480 Encounter for supervision of other normal pregnancy, unspecified trimester: Secondary | ICD-10-CM

## 2013-01-27 DIAGNOSIS — Z3491 Encounter for supervision of normal pregnancy, unspecified, first trimester: Secondary | ICD-10-CM

## 2013-01-27 DIAGNOSIS — J40 Bronchitis, not specified as acute or chronic: Secondary | ICD-10-CM

## 2013-01-27 MED ORDER — HYDROCOD POLST-CHLORPHEN POLST 10-8 MG/5ML PO LQCR: 5.0000 mL | Freq: Every evening | ORAL | Status: DC | PRN

## 2013-01-27 NOTE — Progress Notes (Signed)
Cough and cold Sx x 2 weeks. ABX Rx from urgent care. Yesterday. No fever.

## 2013-01-27 NOTE — Progress Notes (Signed)
P - 104 - Pt has a cough that is causing pain in left side of abd

## 2013-01-27 NOTE — Patient Instructions (Signed)

## 2013-02-24 ENCOUNTER — Ambulatory Visit (INDEPENDENT_AMBULATORY_CARE_PROVIDER_SITE_OTHER): Payer: 59 | Admitting: Advanced Practice Midwife

## 2013-02-24 VITALS — BP 134/77 | Wt 178.0 lb

## 2013-02-24 DIAGNOSIS — Z3491 Encounter for supervision of normal pregnancy, unspecified, first trimester: Secondary | ICD-10-CM

## 2013-02-24 DIAGNOSIS — Z3480 Encounter for supervision of other normal pregnancy, unspecified trimester: Secondary | ICD-10-CM

## 2013-02-24 DIAGNOSIS — Z3482 Encounter for supervision of other normal pregnancy, second trimester: Secondary | ICD-10-CM

## 2013-02-24 DIAGNOSIS — O3660X0 Maternal care for excessive fetal growth, unspecified trimester, not applicable or unspecified: Secondary | ICD-10-CM

## 2013-02-24 LAB — CBC
Hemoglobin: 11 g/dL — ABNORMAL LOW (ref 12.0–15.0)
MCH: 31 pg (ref 26.0–34.0)
MCV: 89.9 fL (ref 78.0–100.0)
Platelets: 279 10*3/uL (ref 150–400)
RBC: 3.55 MIL/uL — ABNORMAL LOW (ref 3.87–5.11)

## 2013-02-24 NOTE — Progress Notes (Signed)
Doing well.  Good fetal movement, denies vaginal bleeding, LOF, regular contractions. URI with cough x5 weeks, improving this week.  Size>Dates today.  U/S ordered for growth.

## 2013-02-24 NOTE — Progress Notes (Signed)
p-94 

## 2013-02-25 ENCOUNTER — Telehealth: Payer: Self-pay | Admitting: *Deleted

## 2013-02-25 NOTE — Telephone Encounter (Signed)
Pt notified via phone that her 28 week labs were WNL

## 2013-02-27 ENCOUNTER — Ambulatory Visit (HOSPITAL_COMMUNITY)
Admission: RE | Admit: 2013-02-27 | Discharge: 2013-02-27 | Disposition: A | Discharge status: Home / Self Care | Payer: 59 | Source: Ambulatory Visit | Attending: Advanced Practice Midwife | Admitting: Advanced Practice Midwife

## 2013-02-27 ENCOUNTER — Ambulatory Visit (HOSPITAL_COMMUNITY): Admission: RE | Admit: 2013-02-27 | Payer: 59 | Source: Ambulatory Visit

## 2013-02-27 DIAGNOSIS — O26849 Uterine size-date discrepancy, unspecified trimester: Secondary | ICD-10-CM

## 2013-02-27 DIAGNOSIS — O09529 Supervision of elderly multigravida, unspecified trimester: Secondary | ICD-10-CM | POA: Insufficient documentation

## 2013-02-27 DIAGNOSIS — O3660X0 Maternal care for excessive fetal growth, unspecified trimester, not applicable or unspecified: Secondary | ICD-10-CM | POA: Insufficient documentation

## 2013-03-04 DIAGNOSIS — O26849 Uterine size-date discrepancy, unspecified trimester: Secondary | ICD-10-CM | POA: Insufficient documentation

## 2013-03-10 ENCOUNTER — Ambulatory Visit (INDEPENDENT_AMBULATORY_CARE_PROVIDER_SITE_OTHER): Payer: 59 | Admitting: Advanced Practice Midwife

## 2013-03-10 VITALS — BP 122/82 | Wt 179.0 lb

## 2013-03-10 DIAGNOSIS — Z3480 Encounter for supervision of other normal pregnancy, unspecified trimester: Secondary | ICD-10-CM

## 2013-03-10 DIAGNOSIS — Z3491 Encounter for supervision of normal pregnancy, unspecified, first trimester: Secondary | ICD-10-CM

## 2013-03-10 NOTE — Progress Notes (Signed)
Growth [place] 3-5 (63%). Normal AFI. Cough resolved.

## 2013-03-10 NOTE — Progress Notes (Signed)
P= 86  

## 2013-03-10 NOTE — Patient Instructions (Signed)
Third Trimester of Pregnancy  The third trimester is from week 29 through week 42, months 7 through 9. The third trimester is a time when the fetus is growing rapidly. At the end of the ninth month, the fetus is about 20 inches in length and weighs 6 10 pounds.   BODY CHANGES  Your body goes through many changes during pregnancy. The changes vary from woman to woman.    Your weight will continue to increase. You can expect to gain 25 35 pounds (11 16 kg) by the end of the pregnancy.   You may begin to get stretch marks on your hips, abdomen, and breasts.   You may urinate more often because the fetus is moving lower into your pelvis and pressing on your bladder.   You may develop or continue to have heartburn as a result of your pregnancy.   You may develop constipation because certain hormones are causing the muscles that push waste through your intestines to slow down.   You may develop hemorrhoids or swollen, bulging veins (varicose veins).   You may have pelvic pain because of the weight gain and pregnancy hormones relaxing your joints between the bones in your pelvis. Back aches may result from over exertion of the muscles supporting your posture.   Your breasts will continue to grow and be tender. A yellow discharge may leak from your breasts called colostrum.   Your belly button may stick out.   You may feel short of breath because of your expanding uterus.   You may notice the fetus "dropping," or moving lower in your abdomen.   You may have a bloody mucus discharge. This usually occurs a few days to a week before labor begins.   Your cervix becomes thin and soft (effaced) near your due date.  WHAT TO EXPECT AT YOUR PRENATAL EXAMS   You will have prenatal exams every 2 weeks until week 36. Then, you will have weekly prenatal exams. During a routine prenatal visit:   You will be weighed to make sure you and the fetus are growing normally.   Your blood pressure is taken.   Your abdomen will be  measured to track your baby's growth.   The fetal heartbeat will be listened to.   Any test results from the previous visit will be discussed.   You may have a cervical check near your due date to see if you have effaced.  At around 36 weeks, your caregiver will check your cervix. At the same time, your caregiver will also perform a test on the secretions of the vaginal tissue. This test is to determine if a type of bacteria, Group B streptococcus, is present. Your caregiver will explain this further.  Your caregiver may ask you:   What your birth plan is.   How you are feeling.   If you are feeling the baby move.   If you have had any abnormal symptoms, such as leaking fluid, bleeding, severe headaches, or abdominal cramping.   If you have any questions.  Other tests or screenings that may be performed during your third trimester include:   Blood tests that check for low iron levels (anemia).   Fetal testing to check the health, activity level, and growth of the fetus. Testing is done if you have certain medical conditions or if there are problems during the pregnancy.  FALSE LABOR  You may feel small, irregular contractions that eventually go away. These are called Braxton Hicks contractions, or   false labor. Contractions may last for hours, days, or even weeks before true labor sets in. If contractions come at regular intervals, intensify, or become painful, it is best to be seen by your caregiver.   SIGNS OF LABOR    Menstrual-like cramps.   Contractions that are 5 minutes apart or less.   Contractions that start on the top of the uterus and spread down to the lower abdomen and back.   A sense of increased pelvic pressure or back pain.   A watery or bloody mucus discharge that comes from the vagina.  If you have any of these signs before the 37th week of pregnancy, call your caregiver right away. You need to go to the hospital to get checked immediately.  HOME CARE INSTRUCTIONS    Avoid all  smoking, herbs, alcohol, and unprescribed drugs. These chemicals affect the formation and growth of the baby.   Follow your caregiver's instructions regarding medicine use. There are medicines that are either safe or unsafe to take during pregnancy.   Exercise only as directed by your caregiver. Experiencing uterine cramps is a good sign to stop exercising.   Continue to eat regular, healthy meals.   Wear a good support bra for breast tenderness.   Do not use hot tubs, steam rooms, or saunas.   Wear your seat belt at all times when driving.   Avoid raw meat, uncooked cheese, cat litter boxes, and soil used by cats. These carry germs that can cause birth defects in the baby.   Take your prenatal vitamins.   Try taking a stool softener (if your caregiver approves) if you develop constipation. Eat more high-fiber foods, such as fresh vegetables or fruit and whole grains. Drink plenty of fluids to keep your urine clear or pale yellow.   Take warm sitz baths to soothe any pain or discomfort caused by hemorrhoids. Use hemorrhoid cream if your caregiver approves.   If you develop varicose veins, wear support hose. Elevate your feet for 15 minutes, 3 4 times a day. Limit salt in your diet.   Avoid heavy lifting, wear low heal shoes, and practice good posture.   Rest a lot with your legs elevated if you have leg cramps or low back pain.   Visit your dentist if you have not gone during your pregnancy. Use a soft toothbrush to brush your teeth and be gentle when you floss.   A sexual relationship may be continued unless your caregiver directs you otherwise.   Do not travel far distances unless it is absolutely necessary and only with the approval of your caregiver.   Take prenatal classes to understand, practice, and ask questions about the labor and delivery.   Make a trial run to the hospital.   Pack your hospital bag.   Prepare the baby's nursery.   Continue to go to all your prenatal visits as directed  by your caregiver.  SEEK MEDICAL CARE IF:   You are unsure if you are in labor or if your water has broken.   You have dizziness.   You have mild pelvic cramps, pelvic pressure, or nagging pain in your abdominal area.   You have persistent nausea, vomiting, or diarrhea.   You have a bad smelling vaginal discharge.   You have pain with urination.  SEEK IMMEDIATE MEDICAL CARE IF:    You have a fever.   You are leaking fluid from your vagina.   You have spotting or bleeding from your vagina.     You have severe abdominal cramping or pain.   You have rapid weight loss or gain.   You have shortness of breath with chest pain.   You notice sudden or extreme swelling of your face, hands, ankles, feet, or legs.   You have not felt your baby move in over an hour.   You have severe headaches that do not go away with medicine.   You have vision changes.  Document Released: 03/21/2001 Document Revised: 11/27/2012 Document Reviewed: 05/28/2012  [employer] Patient Information 2014 [employer].

## 2013-03-31 ENCOUNTER — Ambulatory Visit (INDEPENDENT_AMBULATORY_CARE_PROVIDER_SITE_OTHER): Payer: 59 | Admitting: Advanced Practice Midwife

## 2013-03-31 VITALS — BP 130/78 | Wt 177.0 lb

## 2013-03-31 DIAGNOSIS — Z3480 Encounter for supervision of other normal pregnancy, unspecified trimester: Secondary | ICD-10-CM

## 2013-03-31 DIAGNOSIS — O09529 Supervision of elderly multigravida, unspecified trimester: Secondary | ICD-10-CM

## 2013-03-31 DIAGNOSIS — O09522 Supervision of elderly multigravida, second trimester: Secondary | ICD-10-CM

## 2013-03-31 NOTE — Progress Notes (Signed)
P-101 

## 2013-03-31 NOTE — Progress Notes (Signed)
Doing well.  Good fetal movement, denies vaginal bleeding, LOF, regular contractions.  Reports some occasional braxton-hicks ctx.  Started later in this pregnancy than previous pregnancies.

## 2013-04-14 ENCOUNTER — Ambulatory Visit (INDEPENDENT_AMBULATORY_CARE_PROVIDER_SITE_OTHER): Payer: 59 | Admitting: Advanced Practice Midwife

## 2013-04-14 VITALS — BP 119/78 | Wt 181.0 lb

## 2013-04-14 DIAGNOSIS — Z3483 Encounter for supervision of other normal pregnancy, third trimester: Secondary | ICD-10-CM

## 2013-04-14 DIAGNOSIS — Z3480 Encounter for supervision of other normal pregnancy, unspecified trimester: Secondary | ICD-10-CM

## 2013-04-14 NOTE — Progress Notes (Signed)
P-82 

## 2013-04-14 NOTE — Progress Notes (Signed)
Doing well.  Good fetal movement, denies vaginal bleeding, LOF, regular contractions. Reviewed signs of labor.  Pt has hx fast labors.  GBS collected today.

## 2013-04-15 LAB — GC/CHLAMYDIA PROBE AMP
CT Probe RNA: NEGATIVE
GC Probe RNA: NEGATIVE

## 2013-04-17 LAB — CULTURE, BETA STREP (GROUP B ONLY)

## 2013-04-21 ENCOUNTER — Ambulatory Visit (INDEPENDENT_AMBULATORY_CARE_PROVIDER_SITE_OTHER): Payer: 59 | Admitting: Advanced Practice Midwife

## 2013-04-21 VITALS — BP 127/76 | Wt 182.0 lb

## 2013-04-21 DIAGNOSIS — Z3480 Encounter for supervision of other normal pregnancy, unspecified trimester: Secondary | ICD-10-CM

## 2013-04-21 DIAGNOSIS — Z3493 Encounter for supervision of normal pregnancy, unspecified, third trimester: Secondary | ICD-10-CM

## 2013-04-21 DIAGNOSIS — Z3491 Encounter for supervision of normal pregnancy, unspecified, first trimester: Secondary | ICD-10-CM

## 2013-04-21 NOTE — Progress Notes (Signed)
GBS neg. Questions about Hx shoulder dystocia. Growth US ordered. May discuss IOL at 39 weeks. Pt strongly prefers to avoid pitocin. Discussed other IOL options depending on cervical exam.

## 2013-04-21 NOTE — Progress Notes (Signed)
p-93 

## 2013-04-21 NOTE — Patient Instructions (Signed)
Braxton Hicks Contractions Pregnancy is commonly associated with contractions of the uterus throughout the pregnancy. Towards the end of pregnancy (32 to 34 weeks), these contractions (Braxton Hicks) can develop more often and may become more forceful. This is not true labor because these contractions do not result in opening (dilatation) and thinning of the cervix. They are sometimes difficult to tell apart from true labor because these contractions can be forceful and people have different pain tolerances. You should not feel embarrassed if you go to the hospital with false labor. Sometimes, the only way to tell if you are in true labor is for your caregiver to follow the changes in the cervix. How to tell the difference between true and false labor:  False labor.  The contractions of false labor are usually shorter, irregular and not as hard as those of true labor.  They are often felt in the front of the lower abdomen and in the groin.  They may leave with walking around or changing positions while lying down.  They get weaker and are shorter lasting as time goes on.  These contractions are usually irregular.  They do not usually become progressively stronger, regular and closer together as with true labor.  True labor.  Contractions in true labor last 30 to 70 seconds, become very regular, usually become more intense, and increase in frequency.  They do not go away with walking.  The discomfort is usually felt in the top of the uterus and spreads to the lower abdomen and low back.  True labor can be determined by your caregiver with an exam. This will show that the cervix is dilating and getting thinner. If there are no prenatal problems or other health problems associated with the pregnancy, it is completely safe to be sent home with false labor and await the onset of true labor. HOME CARE INSTRUCTIONS   Keep up with your usual exercises and instructions.  Take medications as  directed.  Keep your regular prenatal appointment.  Eat and drink lightly if you think you are going into labor.  If BH contractions are making you uncomfortable:  Change your activity position from lying down or resting to walking/walking to resting.  Sit and rest in a tub of warm water.  Drink 2 to 3 glasses of water. Dehydration may cause B-H contractions.  Do slow and deep breathing several times an hour. SEEK IMMEDIATE MEDICAL CARE IF:   Your contractions continue to become stronger, more regular, and closer together.  You have a gushing, burst or leaking of fluid from the vagina.  An oral temperature above 102 F (38.9 C) develops.  You have passage of blood-tinged mucus.  You develop vaginal bleeding.  You develop continuous belly (abdominal) pain.  You have low back pain that you never had before.  You feel the baby's head pushing down causing pelvic pressure.  The baby is not moving as much as it used to. Document Released: 03/27/2005 Document Revised: 06/19/2011 Document Reviewed: 01/06/2013 Exit[employer] Patient Information 2014 [employer].  Fetal Movement Counts Patient Name: __________________________________________________ Patient Due Date: ____________________ Performing a fetal movement count is highly recommended in high-risk pregnancies, but it is good for every pregnant woman to do. Your caregiver may ask you to start counting fetal movements at 28 weeks of the pregnancy. Fetal movements often increase:  After eating a full meal.  After physical activity.  After eating or drinking something sweet or cold.  At rest. Pay attention to when you feel   the baby is most active. This will help you notice a pattern of your baby's sleep and wake cycles and what factors contribute to an increase in fetal movement. It is important to perform a fetal movement count at the same time each day when your baby is normally most active.  HOW TO COUNT FETAL  MOVEMENTS 1. Find a quiet and comfortable area to sit or lie down on your left side. Lying on your left side provides the best blood and oxygen circulation to your baby. 2. Write down the day and time on a sheet of paper or in a journal. 3. Start counting kicks, flutters, swishes, rolls, or jabs in a 2 hour period. You should feel at least 10 movements within 2 hours. 4. If you do not feel 10 movements in 2 hours, wait 2 3 hours and count again. Look for a change in the pattern or not enough counts in 2 hours. SEEK MEDICAL CARE IF:  You feel less than 10 counts in 2 hours, tried twice.  There is no movement in over an hour.  The pattern is changing or taking longer each day to reach 10 counts in 2 hours.  You feel the baby is not moving as he or she usually does. Date: ____________ Movements: ____________ Start time: ____________ Finish time: ____________  Date: ____________ Movements: ____________ Start time: ____________ Finish time: ____________ Date: ____________ Movements: ____________ Start time: ____________ Finish time: ____________ Date: ____________ Movements: ____________ Start time: ____________ Finish time: ____________ Date: ____________ Movements: ____________ Start time: ____________ Finish time: ____________ Date: ____________ Movements: ____________ Start time: ____________ Finish time: ____________ Date: ____________ Movements: ____________ Start time: ____________ Finish time: ____________ Date: ____________ Movements: ____________ Start time: ____________ Finish time: ____________  Date: ____________ Movements: ____________ Start time: ____________ Finish time: ____________ Date: ____________ Movements: ____________ Start time: ____________ Finish time: ____________ Date: ____________ Movements: ____________ Start time: ____________ Finish time: ____________ Date: ____________ Movements: ____________ Start time: ____________ Finish time: ____________ Date: ____________  Movements: ____________ Start time: ____________ Finish time: ____________ Date: ____________ Movements: ____________ Start time: ____________ Finish time: ____________ Date: ____________ Movements: ____________ Start time: ____________ Finish time: ____________  Date: ____________ Movements: ____________ Start time: ____________ Finish time: ____________ Date: ____________ Movements: ____________ Start time: ____________ Finish time: ____________ Date: ____________ Movements: ____________ Start time: ____________ Finish time: ____________ Date: ____________ Movements: ____________ Start time: ____________ Finish time: ____________ Date: ____________ Movements: ____________ Start time: ____________ Finish time: ____________ Date: ____________ Movements: ____________ Start time: ____________ Finish time: ____________ Date: ____________ Movements: ____________ Start time: ____________ Finish time: ____________  Date: ____________ Movements: ____________ Start time: ____________ Finish time: ____________ Date: ____________ Movements: ____________ Start time: ____________ Finish time: ____________ Date: ____________ Movements: ____________ Start time: ____________ Finish time: ____________ Date: ____________ Movements: ____________ Start time: ____________ Finish time: ____________ Date: ____________ Movements: ____________ Start time: ____________ Finish time: ____________ Date: ____________ Movements: ____________ Start time: ____________ Finish time: ____________ Date: ____________ Movements: ____________ Start time: ____________ Finish time: ____________  Date: ____________ Movements: ____________ Start time: ____________ Finish time: ____________ Date: ____________ Movements: ____________ Start time: ____________ Finish time: ____________ Date: ____________ Movements: ____________ Start time: ____________ Finish time: ____________ Date: ____________ Movements: ____________ Start time:  ____________ Finish time: ____________ Date: ____________ Movements: ____________ Start time: ____________ Finish time: ____________ Date: ____________ Movements: ____________ Start time: ____________ Finish time: ____________ Date: ____________ Movements: ____________ Start time: ____________ Finish time: ____________  Date: ____________ Movements: ____________ Start time: ____________ Finish time: ____________ Date: ____________ Movements: ____________ Start   time: ____________ Finish time: ____________ Date: ____________ Movements: ____________ Start time: ____________ Finish time: ____________ Date: ____________ Movements: ____________ Start time: ____________ Finish time: ____________ Date: ____________ Movements: ____________ Start time: ____________ Finish time: ____________ Date: ____________ Movements: ____________ Start time: ____________ Finish time: ____________ Date: ____________ Movements: ____________ Start time: ____________ Finish time: ____________  Date: ____________ Movements: ____________ Start time: ____________ Finish time: ____________ Date: ____________ Movements: ____________ Start time: ____________ Finish time: ____________ Date: ____________ Movements: ____________ Start time: ____________ Finish time: ____________ Date: ____________ Movements: ____________ Start time: ____________ Finish time: ____________ Date: ____________ Movements: ____________ Start time: ____________ Finish time: ____________ Date: ____________ Movements: ____________ Start time: ____________ Finish time: ____________ Date: ____________ Movements: ____________ Start time: ____________ Finish time: ____________  Date: ____________ Movements: ____________ Start time: ____________ Finish time: ____________ Date: ____________ Movements: ____________ Start time: ____________ Finish time: ____________ Date: ____________ Movements: ____________ Start time: ____________ Finish time: ____________ Date:  ____________ Movements: ____________ Start time: ____________ Finish time: ____________ Date: ____________ Movements: ____________ Start time: ____________ Finish time: ____________ Date: ____________ Movements: ____________ Start time: ____________ Finish time: ____________ Document Released: 04/26/2006 Document Revised: 03/13/2012 Document Reviewed: 01/22/2012 [employer] Patient Information 2014 [employer].  

## 2013-04-28 ENCOUNTER — Ambulatory Visit (INDEPENDENT_AMBULATORY_CARE_PROVIDER_SITE_OTHER): Payer: 59 | Admitting: Advanced Practice Midwife

## 2013-04-28 ENCOUNTER — Encounter: Payer: Self-pay | Admitting: Advanced Practice Midwife

## 2013-04-28 VITALS — BP 123/70 | Wt 183.0 lb

## 2013-04-28 DIAGNOSIS — Z3493 Encounter for supervision of normal pregnancy, unspecified, third trimester: Secondary | ICD-10-CM

## 2013-04-28 DIAGNOSIS — Z3480 Encounter for supervision of other normal pregnancy, unspecified trimester: Secondary | ICD-10-CM

## 2013-04-28 NOTE — Progress Notes (Signed)
p-93 

## 2013-04-28 NOTE — Patient Instructions (Signed)

## 2013-04-28 NOTE — Progress Notes (Signed)
Doing well. Irregular contractions. Does not want IOL even if baby is big. Does agree to membrane sweeping, but did not work last time. US scheduled for this week. Informed about visitor limitations at hospital (wanted several of her kids there for birth.)

## 2013-05-01 ENCOUNTER — Ambulatory Visit (HOSPITAL_COMMUNITY)
Admission: RE | Admit: 2013-05-01 | Discharge: 2013-05-01 | Disposition: A | Discharge status: Home / Self Care | Payer: 59 | Source: Ambulatory Visit | Attending: Advanced Practice Midwife | Admitting: Advanced Practice Midwife

## 2013-05-01 DIAGNOSIS — O3660X0 Maternal care for excessive fetal growth, unspecified trimester, not applicable or unspecified: Secondary | ICD-10-CM | POA: Insufficient documentation

## 2013-05-01 DIAGNOSIS — O09529 Supervision of elderly multigravida, unspecified trimester: Secondary | ICD-10-CM | POA: Insufficient documentation

## 2013-05-01 DIAGNOSIS — Z3493 Encounter for supervision of normal pregnancy, unspecified, third trimester: Secondary | ICD-10-CM

## 2013-05-02 ENCOUNTER — Encounter: Payer: Self-pay | Admitting: Advanced Practice Midwife

## 2013-05-06 ENCOUNTER — Ambulatory Visit (INDEPENDENT_AMBULATORY_CARE_PROVIDER_SITE_OTHER): Payer: 59 | Admitting: Obstetrics & Gynecology

## 2013-05-06 VITALS — BP 130/83 | Wt 182.0 lb

## 2013-05-06 DIAGNOSIS — Z3491 Encounter for supervision of normal pregnancy, unspecified, first trimester: Secondary | ICD-10-CM

## 2013-05-06 DIAGNOSIS — Z3480 Encounter for supervision of other normal pregnancy, unspecified trimester: Secondary | ICD-10-CM

## 2013-05-06 NOTE — Progress Notes (Signed)
p=110 

## 2013-05-06 NOTE — Progress Notes (Signed)
Long discussion with patient about cesarean section vs vaginal delivery.  Pt had a minor shoulder dystocia with last infant weighing 9 pounds 1 ounce.  Fetal EFW at 38 weeks 4 days was 9 pounds 7 ounces.  Pt feels that the baby is the same weight to slightly bigger than the last baby.  Discussed risks of vaginal delivery and shoulder dystocia.  Discussed maneuvers, risk of 4th degree laceration, fracture of humerus / clavicle, brachial plexus injury / Erb's palsy, fetal anoxic brain injury and fetal death.  Discussed zavenelli maneuver is extreme cases.  The risks of cesarean section discussed with the patient included but were not limited to: bleeding which may require transfusion or reoperation; infection which may require antibiotics; injury to bowel, bladder, ureters or other surrounding organs; injury to the fetus; need for additional procedures including hysterectomy in the event of a life-threatening hemorrhage; placental abnormalities wth subsequent pregnancies, incisional problems, thromboembolic phenomenon and other postoperative/anesthesia complications.  Patient and husband will discuss the information and call the office in the morning.  If patients wants a vaginal delivery, then I would suggest induction to avoid the infant becoming larger.  Pt understands the margin of error is +/- 1.5 pounds at term [facility]   No pregnancy complaints at this time.

## 2013-05-07 ENCOUNTER — Encounter (HOSPITAL_COMMUNITY): Payer: 59 | Admitting: Anesthesiology

## 2013-05-07 ENCOUNTER — Encounter (HOSPITAL_COMMUNITY)
Admission: RE | Disposition: A | Discharge status: Home / Self Care | Payer: Self-pay | Source: Ambulatory Visit | Attending: Obstetrics & Gynecology

## 2013-05-07 ENCOUNTER — Encounter (HOSPITAL_COMMUNITY): Payer: Self-pay | Admitting: Anesthesiology

## 2013-05-07 ENCOUNTER — Inpatient Hospital Stay (HOSPITAL_COMMUNITY)
Admission: RE | Admit: 2013-05-07 | Discharge: 2013-05-09 | DRG: 766 | Disposition: A | Discharge status: Home / Self Care | Payer: 59 | Source: Ambulatory Visit | Attending: Obstetrics & Gynecology | Admitting: Obstetrics & Gynecology

## 2013-05-07 ENCOUNTER — Inpatient Hospital Stay (HOSPITAL_COMMUNITY): Payer: 59 | Admitting: Anesthesiology

## 2013-05-07 DIAGNOSIS — O3660X0 Maternal care for excessive fetal growth, unspecified trimester, not applicable or unspecified: Principal | ICD-10-CM | POA: Diagnosis present

## 2013-05-07 DIAGNOSIS — H538 Other visual disturbances: Secondary | ICD-10-CM

## 2013-05-07 DIAGNOSIS — O26849 Uterine size-date discrepancy, unspecified trimester: Secondary | ICD-10-CM

## 2013-05-07 DIAGNOSIS — Z9889 Other specified postprocedural states: Secondary | ICD-10-CM

## 2013-05-07 DIAGNOSIS — Z3491 Encounter for supervision of normal pregnancy, unspecified, first trimester: Secondary | ICD-10-CM

## 2013-05-07 DIAGNOSIS — O09529 Supervision of elderly multigravida, unspecified trimester: Secondary | ICD-10-CM | POA: Diagnosis present

## 2013-05-07 LAB — CBC
HCT: 33.4 % — ABNORMAL LOW (ref 36.0–46.0)
HEMOGLOBIN: 10.7 g/dL — AB (ref 12.0–15.0)
MCH: 28.7 pg (ref 26.0–34.0)
MCHC: 32 g/dL (ref 30.0–36.0)
MCV: 89.5 fL (ref 78.0–100.0)
Platelets: 278 10*3/uL (ref 150–400)
RBC: 3.73 MIL/uL — AB (ref 3.87–5.11)
RDW: 15.6 % — ABNORMAL HIGH (ref 11.5–15.5)
WBC: 9.5 10*3/uL (ref 4.0–10.5)

## 2013-05-07 LAB — RPR: RPR Ser Ql: NONREACTIVE

## 2013-05-07 LAB — TYPE AND SCREEN
ABO/RH(D): A POS
ANTIBODY SCREEN: NEGATIVE

## 2013-05-07 LAB — ABO/RH: ABO/RH(D): A POS

## 2013-05-07 SURGERY — Surgical Case
Anesthesia: Spinal

## 2013-05-07 MED ORDER — OXYCODONE-ACETAMINOPHEN 5-325 MG PO TABS
1.0000 | ORAL_TABLET | ORAL | Status: DC | PRN
  Administered 2013-05-08 – 2013-05-09 (×6): 1 via ORAL
  Filled 2013-05-07 (×7): qty 1

## 2013-05-07 MED ORDER — SCOPOLAMINE 1 MG/3DAYS TD PT72
1.0000 | MEDICATED_PATCH | Freq: Once | TRANSDERMAL | Status: DC
  Administered 2013-05-07: 1.5 mg via TRANSDERMAL

## 2013-05-07 MED ORDER — FENTANYL CITRATE 0.05 MG/ML IJ SOLN
INTRAMUSCULAR | Status: DC | PRN
  Administered 2013-05-07: 25 ug via INTRATHECAL

## 2013-05-07 MED ORDER — FENTANYL CITRATE 0.05 MG/ML IJ SOLN
INTRAMUSCULAR | Status: AC
  Filled 2013-05-07: qty 2

## 2013-05-07 MED ORDER — MORPHINE SULFATE 0.5 MG/ML IJ SOLN
INTRAMUSCULAR | Status: AC
Start: 2013-05-07 — End: 2013-05-07
  Filled 2013-05-07: qty 10

## 2013-05-07 MED ORDER — DIPHENHYDRAMINE HCL 25 MG PO CAPS
25.0000 mg | ORAL_CAPSULE | ORAL | Status: DC | PRN
  Filled 2013-05-07: qty 1

## 2013-05-07 MED ORDER — SIMETHICONE 80 MG PO CHEW
80.0000 mg | CHEWABLE_TABLET | ORAL | Status: DC | PRN
Start: 2013-05-07 — End: 2013-05-09

## 2013-05-07 MED ORDER — NALOXONE HCL 0.4 MG/ML IJ SOLN: 0.4000 mg | INTRAMUSCULAR | Status: DC | PRN

## 2013-05-07 MED ORDER — LACTATED RINGERS IV SOLN
Freq: Once | INTRAVENOUS | Status: AC
  Administered 2013-05-07: 07:00:00 via INTRAVENOUS

## 2013-05-07 MED ORDER — BUPIVACAINE LIPOSOME 1.3 % IJ SUSP
Freq: Once | INTRAMUSCULAR | Status: AC
  Administered 2013-05-07: 10:00:00
  Filled 2013-05-07 (×2): qty 20

## 2013-05-07 MED ORDER — CEFAZOLIN SODIUM-DEXTROSE 2-3 GM-% IV SOLR
INTRAVENOUS | Status: AC
  Filled 2013-05-07: qty 50

## 2013-05-07 MED ORDER — MISOPROSTOL 200 MCG PO TABS
ORAL_TABLET | ORAL | Status: AC
  Filled 2013-05-07: qty 5

## 2013-05-07 MED ORDER — ONDANSETRON HCL 4 MG/2ML IJ SOLN
4.0000 mg | Freq: Three times a day (TID) | INTRAMUSCULAR | Status: DC | PRN
Start: 2013-05-07 — End: 2013-05-07

## 2013-05-07 MED ORDER — BISACODYL 10 MG RE SUPP: 10.0000 mg | Freq: Every day | RECTAL | Status: DC | PRN

## 2013-05-07 MED ORDER — NALBUPHINE HCL 10 MG/ML IJ SOLN: 5.0000 mg | INTRAMUSCULAR | Status: DC | PRN

## 2013-05-07 MED ORDER — LACTATED RINGERS IV BOLUS (SEPSIS)
1000.0000 mL | Freq: Once | INTRAVENOUS | Status: AC
  Administered 2013-05-07: 1000 mL via INTRAVENOUS

## 2013-05-07 MED ORDER — CEFAZOLIN SODIUM-DEXTROSE 2-3 GM-% IV SOLR
2.0000 g | INTRAVENOUS | Status: AC
  Administered 2013-05-07: 2 g via INTRAVENOUS

## 2013-05-07 MED ORDER — SODIUM CHLORIDE 0.9 % IJ SOLN
INTRAMUSCULAR | Status: AC
Start: 2013-05-07 — End: 2013-05-07
  Filled 2013-05-07: qty 50

## 2013-05-07 MED ORDER — DIPHENHYDRAMINE HCL 25 MG PO CAPS: 25.0000 mg | ORAL_CAPSULE | Freq: Four times a day (QID) | ORAL | Status: DC | PRN

## 2013-05-07 MED ORDER — FLEET ENEMA 7-19 GM/118ML RE ENEM: 1.0000 | ENEMA | Freq: Every day | RECTAL | Status: DC | PRN

## 2013-05-07 MED ORDER — DIBUCAINE 1 % RE OINT
1.0000 | TOPICAL_OINTMENT | RECTAL | Status: DC | PRN
Start: 2013-05-07 — End: 2013-05-09

## 2013-05-07 MED ORDER — ONDANSETRON HCL 4 MG/2ML IJ SOLN
INTRAMUSCULAR | Status: DC | PRN
  Administered 2013-05-07: 4 mg via INTRAVENOUS

## 2013-05-07 MED ORDER — KETOROLAC TROMETHAMINE 30 MG/ML IJ SOLN: 30.0000 mg | Freq: Four times a day (QID) | INTRAMUSCULAR | Status: DC | PRN

## 2013-05-07 MED ORDER — SCOPOLAMINE 1 MG/3DAYS TD PT72
MEDICATED_PATCH | TRANSDERMAL | Status: AC
  Filled 2013-05-07: qty 1

## 2013-05-07 MED ORDER — LACTATED RINGERS IV SOLN
INTRAVENOUS | Status: DC
  Administered 2013-05-07: 16:00:00 via INTRAVENOUS

## 2013-05-07 MED ORDER — SENNOSIDES-DOCUSATE SODIUM 8.6-50 MG PO TABS
2.0000 | ORAL_TABLET | ORAL | Status: DC
Start: 2013-05-08 — End: 2013-05-09
  Administered 2013-05-08 – 2013-05-09 (×2): 2 via ORAL
  Filled 2013-05-07 (×2): qty 2

## 2013-05-07 MED ORDER — DIPHENHYDRAMINE HCL 50 MG/ML IJ SOLN: 12.5000 mg | INTRAMUSCULAR | Status: DC | PRN

## 2013-05-07 MED ORDER — LACTATED RINGERS IV SOLN
INTRAVENOUS | Status: DC | PRN
  Administered 2013-05-07: 09:00:00 via INTRAVENOUS

## 2013-05-07 MED ORDER — LACTATED RINGERS IV SOLN
INTRAVENOUS | Status: DC
  Administered 2013-05-07 (×3): via INTRAVENOUS

## 2013-05-07 MED ORDER — TETANUS-DIPHTH-ACELL PERTUSSIS 5-2.5-18.5 LF-MCG/0.5 IM SUSP: 0.5000 mL | Freq: Once | INTRAMUSCULAR | Status: DC

## 2013-05-07 MED ORDER — OXYTOCIN 10 UNIT/ML IJ SOLN
40.0000 [IU] | INTRAVENOUS | Status: DC | PRN
  Administered 2013-05-07: 40 [IU] via INTRAVENOUS

## 2013-05-07 MED ORDER — METOCLOPRAMIDE HCL 5 MG/ML IJ SOLN: 10.0000 mg | Freq: Three times a day (TID) | INTRAMUSCULAR | Status: DC | PRN

## 2013-05-07 MED ORDER — MENTHOL 3 MG MT LOZG: 1.0000 | LOZENGE | OROMUCOSAL | Status: DC | PRN

## 2013-05-07 MED ORDER — LANOLIN HYDROUS EX OINT
1.0000 | TOPICAL_OINTMENT | CUTANEOUS | Status: DC | PRN
Start: 2013-05-07 — End: 2013-05-09

## 2013-05-07 MED ORDER — NALOXONE HCL 1 MG/ML IJ SOLN
1.0000 ug/kg/h | INTRAVENOUS | Status: DC | PRN
  Filled 2013-05-07: qty 2

## 2013-05-07 MED ORDER — KETOROLAC TROMETHAMINE 30 MG/ML IJ SOLN
30.0000 mg | Freq: Four times a day (QID) | INTRAMUSCULAR | Status: DC | PRN
  Administered 2013-05-07: 30 mg via INTRAMUSCULAR

## 2013-05-07 MED ORDER — SIMETHICONE 80 MG PO CHEW
80.0000 mg | CHEWABLE_TABLET | ORAL | Status: DC
Start: 2013-05-08 — End: 2013-05-09
  Administered 2013-05-09: 80 mg via ORAL
  Filled 2013-05-07 (×2): qty 1

## 2013-05-07 MED ORDER — SIMETHICONE 80 MG PO CHEW
80.0000 mg | CHEWABLE_TABLET | Freq: Three times a day (TID) | ORAL | Status: DC
  Administered 2013-05-07 – 2013-05-09 (×6): 80 mg via ORAL
  Filled 2013-05-07 (×5): qty 1

## 2013-05-07 MED ORDER — KETOROLAC TROMETHAMINE 30 MG/ML IJ SOLN
INTRAMUSCULAR | Status: AC
  Filled 2013-05-07: qty 1

## 2013-05-07 MED ORDER — SODIUM CHLORIDE 0.9 % IJ SOLN: 3.0000 mL | INTRAMUSCULAR | Status: DC | PRN

## 2013-05-07 MED ORDER — IBUPROFEN 600 MG PO TABS
600.0000 mg | ORAL_TABLET | Freq: Four times a day (QID) | ORAL | Status: DC
  Administered 2013-05-07 – 2013-05-09 (×8): 600 mg via ORAL
  Filled 2013-05-07 (×8): qty 1

## 2013-05-07 MED ORDER — PRENATAL MULTIVITAMIN CH
1.0000 | ORAL_TABLET | Freq: Every day | ORAL | Status: DC
  Administered 2013-05-08 – 2013-05-09 (×2): 1 via ORAL
  Filled 2013-05-07 (×2): qty 1

## 2013-05-07 MED ORDER — OXYTOCIN 40 UNITS IN LACTATED RINGERS INFUSION - SIMPLE MED: 62.5000 mL/h | INTRAVENOUS | Status: AC

## 2013-05-07 MED ORDER — WITCH HAZEL-GLYCERIN EX PADS
1.0000 | MEDICATED_PAD | CUTANEOUS | Status: DC | PRN
Start: 2013-05-07 — End: 2013-05-09

## 2013-05-07 MED ORDER — ZOLPIDEM TARTRATE 5 MG PO TABS: 5.0000 mg | ORAL_TABLET | Freq: Every evening | ORAL | Status: DC | PRN

## 2013-05-07 MED ORDER — ONDANSETRON HCL 4 MG PO TABS: 4.0000 mg | ORAL_TABLET | ORAL | Status: DC | PRN

## 2013-05-07 MED ORDER — SCOPOLAMINE 1 MG/3DAYS TD PT72
1.0000 | MEDICATED_PATCH | Freq: Once | TRANSDERMAL | Status: DC
Start: 2013-05-07 — End: 2013-05-07

## 2013-05-07 MED ORDER — DIPHENHYDRAMINE HCL 50 MG/ML IJ SOLN
25.0000 mg | INTRAMUSCULAR | Status: DC | PRN
Start: 2013-05-07 — End: 2013-05-07

## 2013-05-07 MED ORDER — LACTATED RINGERS IV SOLN: INTRAVENOUS | Status: DC

## 2013-05-07 MED ORDER — SODIUM CHLORIDE 0.9 % IJ SOLN: Freq: Once | INTRAMUSCULAR | Status: DC

## 2013-05-07 MED ORDER — ONDANSETRON HCL 4 MG/2ML IJ SOLN: 4.0000 mg | INTRAMUSCULAR | Status: DC | PRN

## 2013-05-07 MED ORDER — HYDROMORPHONE HCL PF 1 MG/ML IJ SOLN: 0.2500 mg | INTRAMUSCULAR | Status: DC | PRN

## 2013-05-07 MED ORDER — MORPHINE SULFATE (PF) 0.5 MG/ML IJ SOLN
INTRAMUSCULAR | Status: DC | PRN
  Administered 2013-05-07: .15 mg via INTRATHECAL

## 2013-05-07 MED ORDER — MEPERIDINE HCL 25 MG/ML IJ SOLN: 6.2500 mg | INTRAMUSCULAR | Status: DC | PRN

## 2013-05-07 SURGICAL SUPPLY — 38 items
BENZOIN TINCTURE PRP APPL 2/3 (GAUZE/BANDAGES/DRESSINGS) ×3 IMPLANT
CLAMP CORD UMBIL (MISCELLANEOUS) IMPLANT
CLOSURE WOUND 1/2 X4 (GAUZE/BANDAGES/DRESSINGS) ×1
CLOTH BEACON ORANGE TIMEOUT ST (SAFETY) ×3 IMPLANT
CONTAINER PREFILL 10% NBF 15ML (MISCELLANEOUS) IMPLANT
DRAIN JACKSON PRT FLT 7MM (DRAIN) IMPLANT
DRAPE LG THREE QUARTER DISP (DRAPES) IMPLANT
DRSG OPSITE POSTOP 4X10 (GAUZE/BANDAGES/DRESSINGS) ×3 IMPLANT
DURAPREP 26ML APPLICATOR (WOUND CARE) ×3 IMPLANT
ELECT REM PT RETURN 9FT ADLT (ELECTROSURGICAL) ×3
ELECTRODE REM PT RTRN 9FT ADLT (ELECTROSURGICAL) ×1 IMPLANT
EVACUATOR SILICONE 100CC (DRAIN) IMPLANT
EXTRACTOR VACUUM M CUP 4 TUBE (SUCTIONS) IMPLANT
EXTRACTOR VACUUM M CUP 4' TUBE (SUCTIONS)
GAUZE SPONGE 4X4 12PLY STRL LF (GAUZE/BANDAGES/DRESSINGS) ×3 IMPLANT
GLOVE BIO SURGEON STRL SZ7 (GLOVE) ×3 IMPLANT
GLOVE BIOGEL PI IND STRL 7.0 (GLOVE) ×1 IMPLANT
GLOVE BIOGEL PI INDICATOR 7.0 (GLOVE) ×2
GOWN STRL REUS W/TWL LRG LVL3 (GOWN DISPOSABLE) ×6 IMPLANT
KIT ABG SYR 3ML LUER SLIP (SYRINGE) IMPLANT
NEEDLE HYPO 25X5/8 SAFETYGLIDE (NEEDLE) ×3 IMPLANT
NS IRRIG 1000ML POUR BTL (IV SOLUTION) ×3 IMPLANT
PACK C SECTION WH (CUSTOM PROCEDURE TRAY) ×3 IMPLANT
PAD ABD 7.5X8 STRL (GAUZE/BANDAGES/DRESSINGS) ×3 IMPLANT
PAD OB MATERNITY 4.3X12.25 (PERSONAL CARE ITEMS) ×3 IMPLANT
RTRCTR C-SECT PINK 25CM LRG (MISCELLANEOUS) ×3 IMPLANT
STAPLER VISISTAT 35W (STAPLE) IMPLANT
STRIP CLOSURE SKIN 1/2X4 (GAUZE/BANDAGES/DRESSINGS) ×2 IMPLANT
SUT CHROMIC 2 0 SH (SUTURE) ×3 IMPLANT
SUT MNCRL 0 VIOLET CTX 36 (SUTURE) ×2 IMPLANT
SUT MONOCRYL 0 CTX 36 (SUTURE) ×4
SUT VIC AB 0 CTX 36 (SUTURE) ×10
SUT VIC AB 0 CTX36XBRD ANBCTRL (SUTURE) ×5 IMPLANT
SUT VIC AB 4-0 KS 27 (SUTURE) ×3 IMPLANT
SYR 30ML LL (SYRINGE) ×3 IMPLANT
TOWEL OR 17X24 6PK STRL BLUE (TOWEL DISPOSABLE) ×3 IMPLANT
TRAY FOLEY CATH 14FR (SET/KITS/TRAYS/PACK) ×3 IMPLANT
WATER STERILE IRR 1000ML POUR (IV SOLUTION) ×3 IMPLANT

## 2013-05-07 NOTE — Progress Notes (Signed)
Called T/S Lindsey Porter and informed her re decreased UO

## 2013-05-07 NOTE — Anesthesia Postprocedure Evaluation (Signed)
  Anesthesia Post-op Note  Patient: Lindsey buyerCrystal Porter  Procedure(s) Performed: Procedure(s) with comments: CESAREAN SECTION (N/A) - Primary  Patient Location: Mother/Baby  Anesthesia Type:Spinal  Level of Consciousness: awake, alert , oriented and patient cooperative  Airway and Oxygen Therapy: Patient Spontanous Breathing  Post-op Pain: mild  Post-op Assessment: Patient's Cardiovascular Status Stable, Respiratory Function Stable, Patent Airway, No signs of Nausea or vomiting, Adequate PO intake and Pain level controlled  Post-op Vital Signs: Reviewed and stable  Complications: No apparent anesthesia complications

## 2013-05-07 NOTE — Anesthesia Procedure Notes (Addendum)
Anesthesia Regional Block:   Narrative:    Spinal  Patient location during procedure: OR Preanesthetic Checklist Completed: patient identified, site marked, surgical consent, pre-op evaluation, timeout performed, IV checked, risks and benefits discussed and monitors and equipment checked Spinal Block Patient position: sitting Prep: DuraPrep Patient monitoring: heart rate, cardiac monitor, continuous pulse ox and blood pressure Approach: midline Location: L3-4 Injection technique: single-shot Needle Needle type: Sprotte  Needle gauge: 24 G Needle length: 9 cm Assessment Sensory level: T4 Additional Notes Spinal Dosage in OR  Bupivicaine ml       1.4 PFMS04   mcg        150 Fentanyl mcg            25    

## 2013-05-07 NOTE — H&P (Signed)
LABOR ADMISSION HISTORY AND PHYSICAL   Lindsey Porter is a 38 y.o. female [phone] with IUP at 17w3dby LMP presenting for elective primary c/s for presumed fetal macrosomia on [place] hx of previous shoulder dystocia.  Pt has been doing well. No regular contractions. No LOF, VB. +FM.   [facility] [place]@ 367w4dhowed an EFW of 9lb7oz or 4283gm with an AC>97%ile.  Labs including early and late trimester glucola were normal.  Pt was met with and risks of c/s vs. Vaginal delivery with possible shoulder dystocia discussed. Pt requested primary c/s.     Prenatal History/Complications:  Past Medical History: Past Medical History  Diagnosis Date  . Heart murmur   . Anemia     1881o    Past Surgical History: Past Surgical History  Procedure Laterality Date  . Wisdom tooth extraction      Obstetrical History: OB History   Grav Para Term Preterm Abortions TAB SAB Ect Mult Living   _0 All term SVD with larged being 9lb 1 oz and having a 1 minute shoulder dystocia.    Social History: History   Social History  . Marital Status: Married    Spouse Name: N/A    Number of Children: N/A  . Years of Education: N/A   Social History Main Topics  . Smoking status: Never Smoker   . Smokeless tobacco: Never Used  . Alcohol Use: No  . Drug Use: No  . Sexual Activity: Yes   Other Topics Concern  . Not on file   Social History Narrative  . No narrative on file    Family History: No family history on file.  Allergies: No Known Allergies  No prescriptions prior to admission     Review of Systems  All systems reviewed and negative except as stated in HPI    Last menstrual period 08/04/2012. General appearance: alert, cooperative and no distress Lungs: clear to auscultation bilaterally Heart: regular rate and rhythm Abdomen: soft, non-tender; bowel sounds normal Extremities: Homans sign is negative, no sign of DVT  Presentation:  cephalic      Prenatal labs: ABO, Rh: A/POS/-- (06/30 1100) Antibody: NEG (06/30 1100) Rubella:   RPR: NON REAC (11/17 0958)  HBsAg: NEGATIVE (06/30 1100)  HIV: NON REACTIVE (11/17 0958)  GBS:    1 hr Glucola 112 Genetic screening  declined Anatomy [place]    Assessment: Lindsey Porter a [age].o. [id number] an IUP at 399w3desenting for elective primary c/s due to presumed fetal macrosomia on [place] hx of shoulder dystocia with smaller baby.   Plan: The risks of cesarean section discussed with the patient included but were not limited to: bleeding which may require transfusion or reoperation; infection which may require antibiotics; injury to bowel, bladder, ureters or other surrounding organs; injury to the fetus; need for additional procedures including hysterectomy in the event of a life-threatening hemorrhage; placental abnormalities wth subsequent pregnancies, incisional problems, thromboembolic phenomenon and other postoperative/anesthesia complications. The patient concurred with the proposed plan, giving informed written consent for the procedure.   Patient has been NPO since midnight. she will remain NPO for procedure. Anesthesia and OR aware. Preoperative prophylactic antibiotics and SCDs ordered on call to the OR.  To OR when ready.     Porter, Lindsey L, MD 05/07/2013, 4:37 AM   Pt seen and examined.  Consent obtained.  Abx ordered.  Ready for OR Lindsey Paci H.

## 2013-05-07 NOTE — Anesthesia Preprocedure Evaluation (Addendum)

## 2013-05-07 NOTE — Transfer of Care (Signed)
Immediate Anesthesia Transfer of Care Note  Patient: Lindsey Porter  Procedure(s) Performed: Procedure(s) with comments: CESAREAN SECTION (N/A) - Primary  Patient Location: PACU  Anesthesia Type:Spinal  Level of Consciousness: awake, alert  and oriented  Airway & Oxygen Therapy: Patient Spontanous Breathing  Post-op Assessment: Report given to PACU RN and Post -op Vital signs reviewed and stable  Post vital signs: stable  Complications: No apparent anesthesia complications

## 2013-05-07 NOTE — Anesthesia Postprocedure Evaluation (Signed)
  Anesthesia Post-op Note  Patient: Lindsey Porter  Procedure(s) Performed: Procedure(s) with comments: CESAREAN SECTION (N/A) - Primary  Patient is awake, responsive, moving her legs, and has signs of resolution of her numbness. Pain and nausea are reasonably well controlled. Vital signs are stable and clinically acceptable. Oxygen saturation is clinically acceptable. There are no apparent anesthetic complications at this time. Patient is ready for discharge.

## 2013-05-07 NOTE — Op Note (Signed)
Lindsey Porter PROCEDURE DATE: 05/07/2013  PREOPERATIVE DIAGNOSES: Intrauterine pregnancy at  3112w3d weeks gestation; macrosomia  POSTOPERATIVE DIAGNOSES: The same  PROCEDURE: Primary elective Low Transverse Cesarean Section  SURGEON:  Dr. Penne LashLeggett  ASSISTANT:  Dr. Ike Benedom  INDICATIONS: Lindsey CotaCrystal Porter is a 38 y.o. [id number] at 812w3d here for cesarean section secondary to the indications listed under preoperative diagnoses; please see preoperative note for further details.  The risks of cesarean section were discussed with the patient including but were not limited to: bleeding which may require transfusion or reoperation; infection which may require antibiotics; injury to bowel, bladder, ureters or other surrounding organs; injury to the fetus; need for additional procedures including hysterectomy in the event of a life-threatening hemorrhage; placental abnormalities wth subsequent pregnancies, incisional problems, thromboembolic phenomenon and other postoperative/anesthesia complications.   The patient concurred with the proposed plan, giving informed written consent for the procedure.    FINDINGS:  Viable female infant in cephalic presentation.  Apgars 8 and 9.  Clear amniotic fluid.  Intact placenta, three vessel cord.  Normal uterus, fallopian tubes and ovaries bilaterally.  ANESTHESIA: Spinal INTRAVENOUS FLUIDS: 2600 ml ESTIMATED BLOOD LOSS: 600 ml URINE OUTPUT:  300 ml SPECIMENS: Placenta sent to L&D COMPLICATIONS: None immediate  PROCEDURE IN DETAIL:  The patient preoperatively received intravenous antibiotics and had sequential compression devices applied to her lower extremities.  She was then taken to the operating room where spinal anesthesia was administered and was found to be adequate. She was then placed in a dorsal supine position with a leftward tilt, and prepped and draped in a sterile manner.  A foley catheter was placed into her bladder and attached to constant gravity.  After an  adequate timeout was performed, a Pfannenstiel skin incision was made with scalpel and carried through to the underlying layer of fascia. The fascia was incised in the midline, and this incision was extended bilaterally using the Mayo scissors.  Kocher clamps were applied to the superior aspect of the fascial incision and the underlying rectus muscles were dissected off bluntly. A similar process was carried out on the inferior aspect of the fascial incision. The rectus muscles were separated in the midline bluntly and the peritoneum was entered bluntly. Attention was turned to the lower uterine segment where a low transverse hysterotomy was made with a scalpel and extended bilaterally bluntly.  The infant was successfully delivered, the cord was clamped and cut and the infant was handed over to awaiting neonatology team. Uterine massage was then administered, and the placenta delivered intact with a three-vessel cord. The uterus was then cleared of clot and debris.  The hysterotomy was closed with 0 Vicryl in a running locked fashion, and an imbricating layer was also placed with 0 monocryl. Several figure of 8 required for hemostasis. The pelvis was cleared of all clot and debris. Hemostasis was confirmed on all surfaces.  The peritoneum was reapproximated using 0 Vicryl interrupted stitches. The fascia was then closed using 0 Vicryl  in a running fashion.  The subcutaneous layer was irrigated and 60 ml of 1/2 xperil, 1/2 saline was injected subcutaneously around the incision.  The skin was closed with a 4-0 Vicryl subcuticular stitch. The patient tolerated the procedure well. Sponge, lap, instrument and needle counts were correct x 2.  She was taken to the recovery room in stable condition.   Lindsey ScaleMichael Ryan Terriann Difonzo, MD OB Fellow

## 2013-05-08 ENCOUNTER — Encounter (HOSPITAL_COMMUNITY): Payer: Self-pay | Admitting: Obstetrics & Gynecology

## 2013-05-08 LAB — CBC
HCT: 26.9 % — ABNORMAL LOW (ref 36.0–46.0)
Hemoglobin: 8.4 g/dL — ABNORMAL LOW (ref 12.0–15.0)
MCH: 28.1 pg (ref 26.0–34.0)
MCHC: 31.2 g/dL (ref 30.0–36.0)
MCV: 90 fL (ref 78.0–100.0)
PLATELETS: 247 10*3/uL (ref 150–400)
RBC: 2.99 MIL/uL — AB (ref 3.87–5.11)
RDW: 15.5 % (ref 11.5–15.5)
WBC: 12 10*3/uL — AB (ref 4.0–10.5)

## 2013-05-08 LAB — BIRTH TISSUE RECOVERY COLLECTION (PLACENTA DONATION)

## 2013-05-08 MED ORDER — HYDROCORTISONE 1 % EX CREA
TOPICAL_CREAM | Freq: Four times a day (QID) | CUTANEOUS | Status: DC
  Administered 2013-05-08 – 2013-05-09 (×2): via TOPICAL
  Filled 2013-05-08: qty 28

## 2013-05-08 NOTE — Progress Notes (Signed)
Post Partum Day 1 Subjective: Patient resting comfortably. Foley out. Has not voided yet without foley. Has not yet passed stool.  Blurry vision with reading but she states this normal for her postpartum. No blurry vision or dizziness otherwise.    Objective: Blood pressure 103/51, pulse 64, temperature 97.5 F (36.4 C), temperature source Oral, resp. rate 18, height 5\' 5"  (1.651 m), weight 83.008 kg (183 lb), last menstrual period 08/04/2012, SpO2 97.00%, unknown if currently breastfeeding.  Physical Exam:  General: alert, cooperative, appears stated age and no distress Uterine Fundus: firm Incision: no significant drainage, no significant erythema DVT Evaluation: No evidence of DVT seen on physical exam.   Recent Labs  05/07/13 0652 05/08/13 0635  HGB 10.7* 8.4*  HCT 33.4* 26.9*    Assessment/Plan: Plan for discharge tomorrow  #S/p PLTCS POD#1 - Stable.  - Continue to monitor for symptomatic hypotension. - Ensure voiding/ passing stool - Desire circumcision  # MOC: Natural/rhythm method or condoms # MOF: Breast    LOS: 1 day   Quincy SimmondsFeeney, Drey Shaff L 05/08/2013, 7:54 AM

## 2013-05-08 NOTE — Progress Notes (Signed)
I have seen and examined this patient and I agree with the above. Cam HaiSHAW, Devian Bartolomei 9:01 AM 05/08/2013

## 2013-05-09 MED ORDER — OXYCODONE-ACETAMINOPHEN 5-325 MG PO TABS: 1.0000 | ORAL_TABLET | ORAL | Status: DC | PRN

## 2013-05-09 NOTE — Discharge Summary (Signed)
Obstetric Discharge Summary Reason for Admission: induction of labor and h/o shoulder dystocia, suspected fetal macrosomia Prenatal Procedures: none Intrapartum Procedures: cesarean: low cervical, transverse Postpartum Procedures: none Complications-Operative and Postpartum: none   Hemoglobin  Date Value Range Status  05/08/2013 8.4* 12.0 - 15.0 g/dL Final     DELTA CHECK NOTED     REPEATED TO VERIFY     HCT  Date Value Range Status  05/08/2013 26.9* 36.0 - 46.0 % Final    Physical Exam:  General: alert, cooperative, appears stated age and no distress Uterine Fundus: Abdomen soft, distended. Difficult palpation of uterus - soft.  Incision: no significant drainage, no significant erythema DVT Evaluation: No evidence of DVT seen on physical exam. Negative Homan's sign. No cords or calf tenderness. Calf/Ankle edema is present.  Discharge Diagnoses: Term Pregnancy-delivered via cesarean section  Discharge Information: Date: 05/09/2013 Activity: Limit lifting to 10lbs. Do not strain abdominal muscles Diet: routine Medications: PNV and Percocet Condition: stable Instructions: refer to practice specific booklet Discharge to: home Follow-up Information   Follow up with [facility] [place]. Schedule an appointment as soon as possible for a visit in 4 weeks. (Call and make an appointment to followup with your OB in 4 weeks)       Hospital course: 38 y.o.[id number]  Induced at 39.3 for suspected fetal macrosomia and history of shoulder dystocia. Primary cesarean section. No complications. Patient complained of blurry vision POD#1, which occurred only when reading/seeing things close up, but normal when seeing far away. Patient stated her vision often changes with pregnancy and delivery.  We also suspect this transient blurry vision could be a combination of percocet and scopolamine. Improved POD#2.  Denies dizziness. +void, + flatus. Pain managed adequately with  medication.  MOC: natural/rhythm method/condoms MOF: breast  Newborn Data: Live born female  Birth Weight: 9 lb 0.3 oz (4090 g) APGAR: 8, 9  Home with mother.  Quincy SimmondsFeeney, Patricia L 05/09/2013, 9:10 AM  I have seen and examined this patient and agree with above documentation in the resident's note.   Rulon AbideKeli Elihue Ebert, M.D. [facility] 05/09/2013 1:08 PM

## 2013-05-09 NOTE — Op Note (Signed)
Present for Entire Procedure.  Attestation of Attending Supervision of Fellow: Evaluation and management procedures were performed by the Fellow under my supervision and collaboration. I have reviewed the Fellow's note and chart, and I agree with the management and plan.  

## 2013-05-09 NOTE — Discharge Summary (Signed)
Attestation of Attending Supervision of Fellow: Evaluation and management procedures were performed by the Fellow under my supervision and collaboration.  I have reviewed the Fellow's note and chart, and I agree with the management and plan.    

## 2013-06-11 ENCOUNTER — Encounter: Payer: Self-pay | Admitting: Obstetrics & Gynecology

## 2013-06-11 ENCOUNTER — Ambulatory Visit (INDEPENDENT_AMBULATORY_CARE_PROVIDER_SITE_OTHER): Payer: 59 | Admitting: Obstetrics & Gynecology

## 2013-06-11 DIAGNOSIS — Z9889 Other specified postprocedural states: Secondary | ICD-10-CM

## 2013-06-11 NOTE — Progress Notes (Signed)
   Subjective:    Patient ID: Lindsey Porter, female    DOB: 12-Aug-1975, 38 y.o.   MRN: [id number]  HPI  Lindsey Porter is a 38 yo MW P73 with a 286 week old son and 2 teenagers at home. She is here to a pp/po visit after her PLTCS (macrosomia and h/o shoulder dystocia). She has no complaints. She reports normal bowel and bladder function. She has not had sex yet. She intends to use condoms prn. She is exclusively breastfeeding and the baby is growing well and sleeping about 5 hours each night. She denies any problems.  Review of Systems     Objective:   Physical Exam  Well-healed incision Uterus involuted normally and non-tender      Assessment & Plan:  PP/PO- doing well RTC 6 months for annual/mammogram when she finishes breastfeeding

## 2014-02-09 ENCOUNTER — Encounter: Payer: Self-pay | Admitting: Obstetrics & Gynecology

## 2014-03-13 IMAGING — US US OB FOLLOW-UP
1 series · 12 of 28 positions shown · non-contrast
Comparison: none

[Series 1: us ob follow up · 57 acquisitions, 12 frames shown]
[im 3/57]
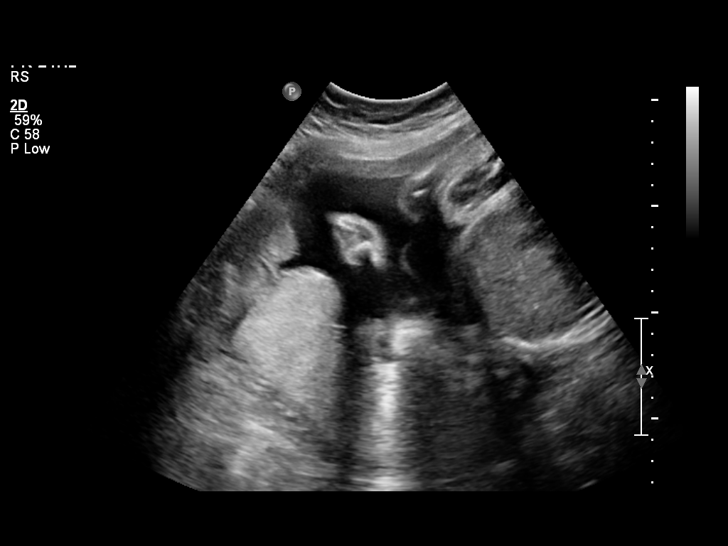
[im 7/57]
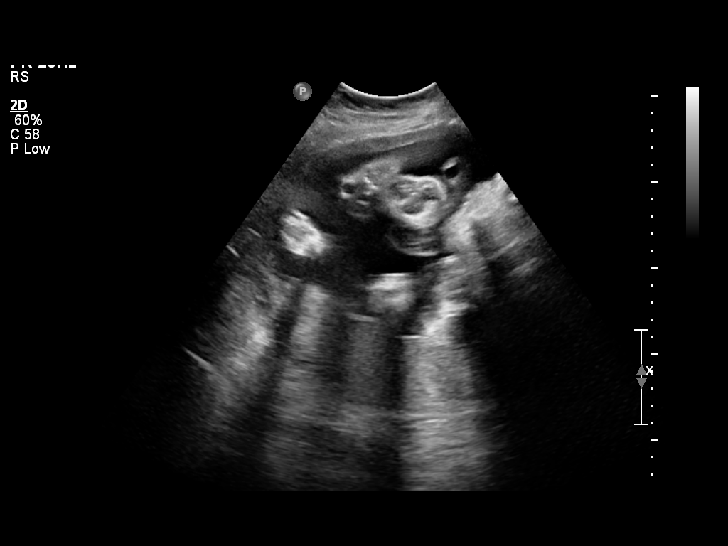
[im 11/57]
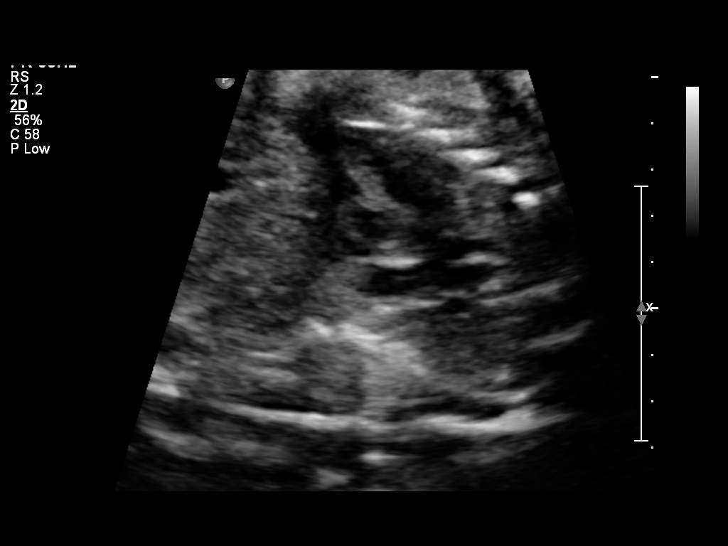
[im 17/57]
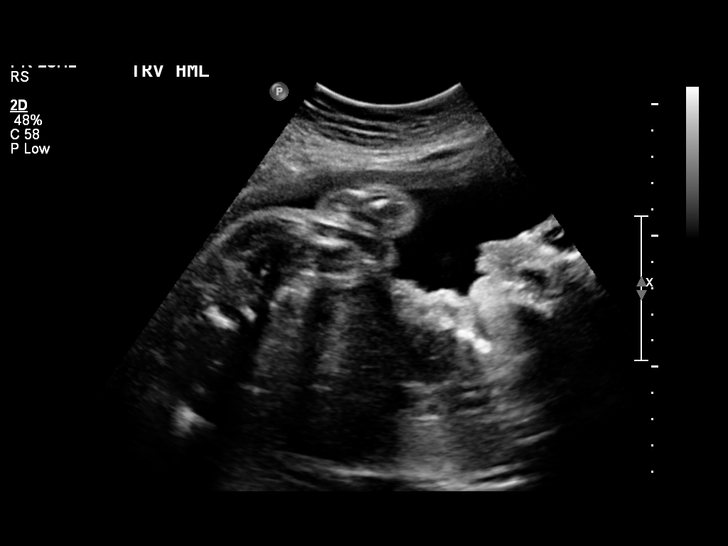
[im 21/57]
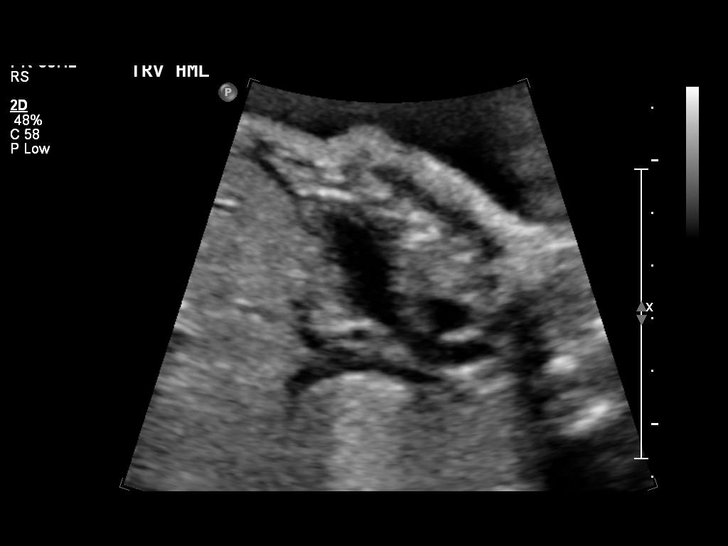
[im 25/57]
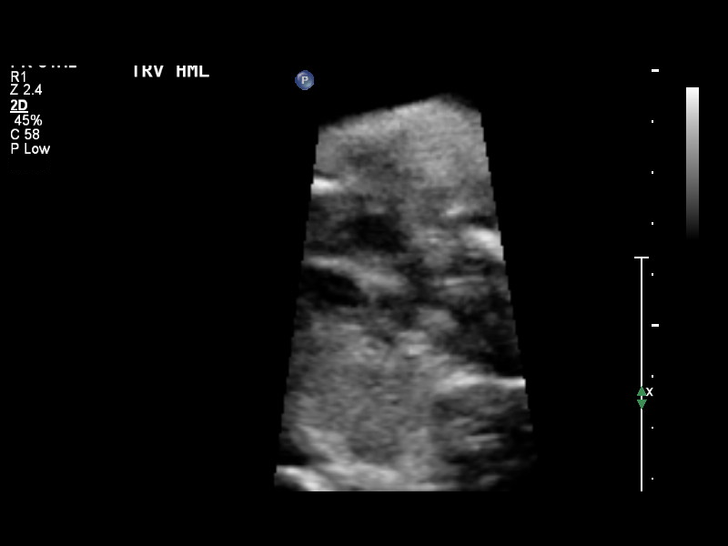
[im 32/57]
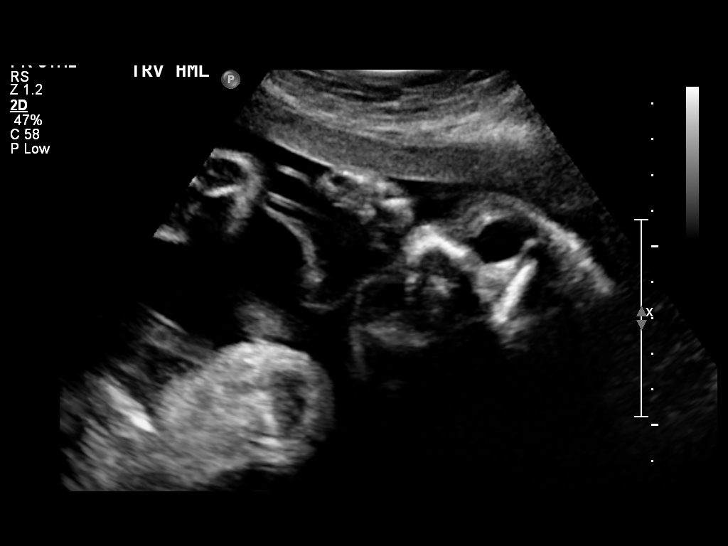
[im 36/57]
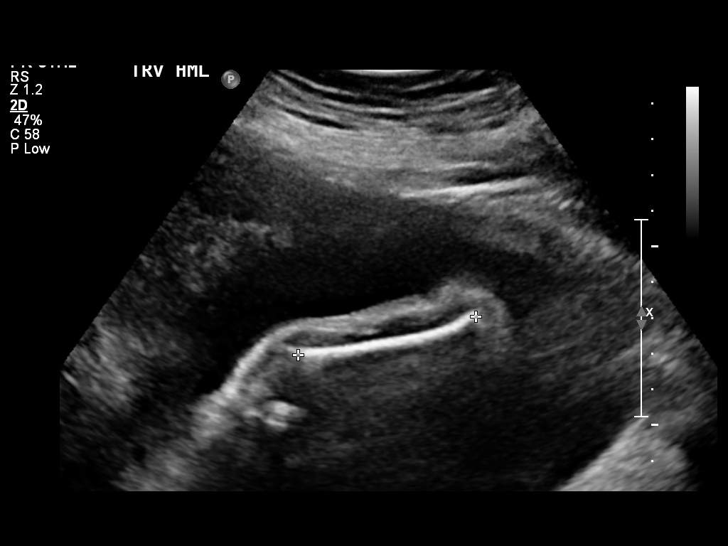
[im 40/57]
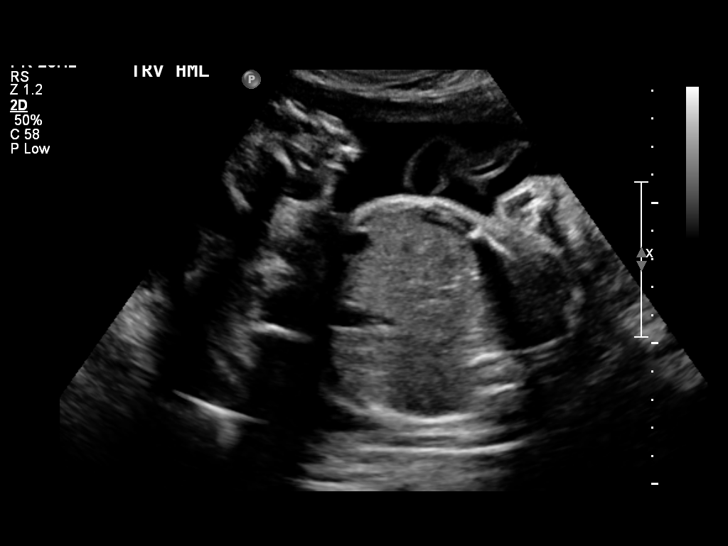
[im 46/57]
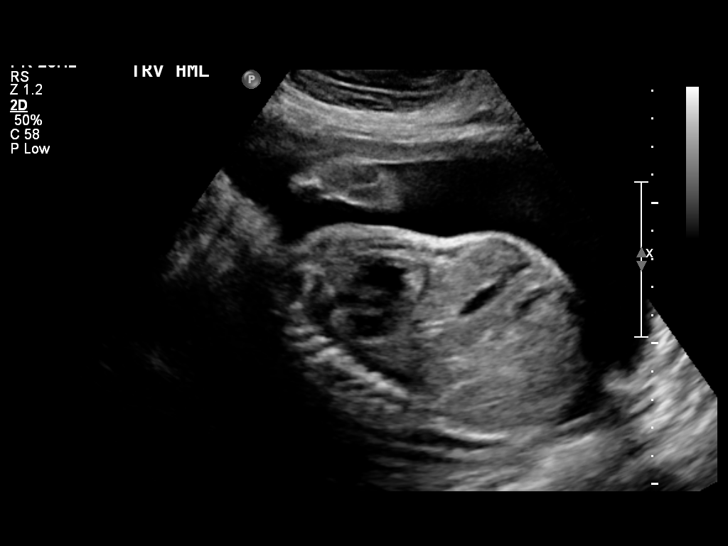
[im 50/57]
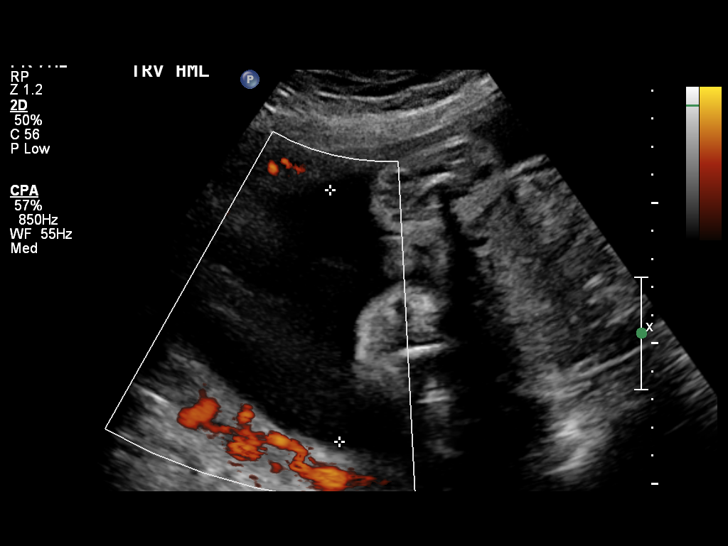
[im 54/57]
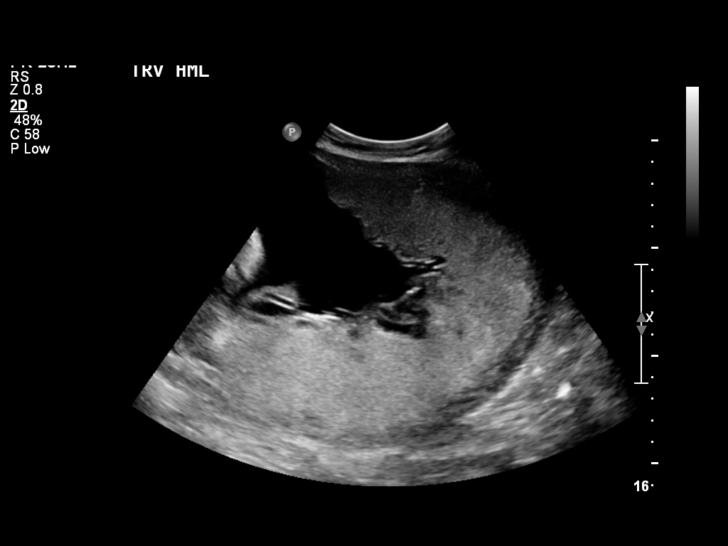

[12 of 28 positions shown; findings below may reference images not displayed]

OBSTETRICS REPORT
                      (Signed Final 02/27/2013 [DATE])

                                                         CNM
Service(s) Provided

 US OB FOLLOW UP                                       76816.1
Indications

 Size greater than dates (Large for gestational [AGE]
 Advanced maternal age (37), Multigravida;
 declined testing
Fetal Evaluation

 Num Of Fetuses:    1
 Fetal Heart Rate:  140                          bpm
 Cardiac Activity:  Observed
 Presentation:      Transverse, head to
                    maternal left
 Placenta:          Posterior Fundal, above
                    cervical os
 P. Cord            Previously Visualized
 Insertion:

 Amniotic Fluid
 AFI FV:      Subjectively within normal limits
 AFI Sum:     20.43   cm       80  %Tile     Larg Pckt:    8.92  cm
 RUQ:   6.03    cm   RLQ:    8.92   cm    LUQ:   3.79    cm   LLQ:    1.69   cm
Biometry

 BPD:       74  mm     G. Age:  29w 5d                CI:        70.79   70 - 86
                                                      FL/HC:      19.4   19.2 -

 HC:     280.3  mm     G. Age:  30w 5d       49  %    HC/AC:      1.05   0.99 -

 AC:     268.2  mm     G. Age:  30w 6d       81  %    FL/BPD:     73.5   71 - 87
 FL:      54.4  mm     G. Age:  28w 5d       17  %    FL/AC:      20.3   20 - 24
 HUM:     50.7  mm     G. Age:  29w 5d       51  %

 Est. FW:    7870  gm      3 lb 5 oz     63  %
Gestational Age

 LMP:           29w 4d        Date:  08/04/12                 EDD:   05/11/13
 U/S Today:     30w 0d                                        EDD:   05/08/13
 Best:          29w 4d     Det. By:  LMP  (08/04/12)          EDD:   05/11/13
Anatomy

 Cranium:          Appears normal         Aortic Arch:      Appears normal
 Fetal Cavum:      Appears normal         Ductal Arch:      Appears normal
 Ventricles:       Appears normal         Diaphragm:        Previously seen
 Choroid Plexus:   Previously seen        Stomach:          Appears normal, left
                                                            sided
 Cerebellum:       Previously seen        Abdomen:          Appears normal
 Posterior Fossa:  Previously seen        Abdominal Wall:   Appears nml (cord
                                                            insert, abd wall)
 Nuchal Fold:      Previously seen        Cord Vessels:     Appears normal (3
                                                            vessel cord)
 Face:             Orbits and profile     Kidneys:          Appear normal
                   previously seen
 Lips:             Previously seen        Bladder:          Appears normal
 Heart:            Appears normal         Spine:            Previously seen
                   (4CH, axis, and
                   situs)
 RVOT:             Appears normal         Lower             Previously seen
                                          Extremities:
 LVOT:             Appears normal         Upper             Previously seen
                                          Extremities:

 Other:  Fetus appears to be a male. Parents do not wish to know sex of fetus.
         Heels visualized. Nasal bone visualized. Technically difficult due to
         fetal position.
Cervix Uterus Adnexa

 Cervical Length:    5.95     cm

 Cervix:       Normal appearance by transabdominal scan.
 Uterus:       No abnormality visualized.
 Cul De Sac:   No free fluid seen.

 Left Ovary:    Not visualized.
 Right Ovary:   Not visualized.
 Adnexa:     No abnormality visualized.
Impression

 IUP at 29+4 weeks
 Normal interval anatomy; anatomic survey complete
 Normal amniotic fluid volume
 Appropriate interval growth with EFW at the 63rd %tile
Recommendations

 Follow-up as clinically indicated

 questions or concerns.

## 2014-05-15 IMAGING — US US OB FOLLOW-UP
1 series · 12 of 28 positions shown · non-contrast
Comparison: none

[Series 1: us ob follow-up · 0.26mm/px · 12 of 29 slices shown]
[im 2/29]
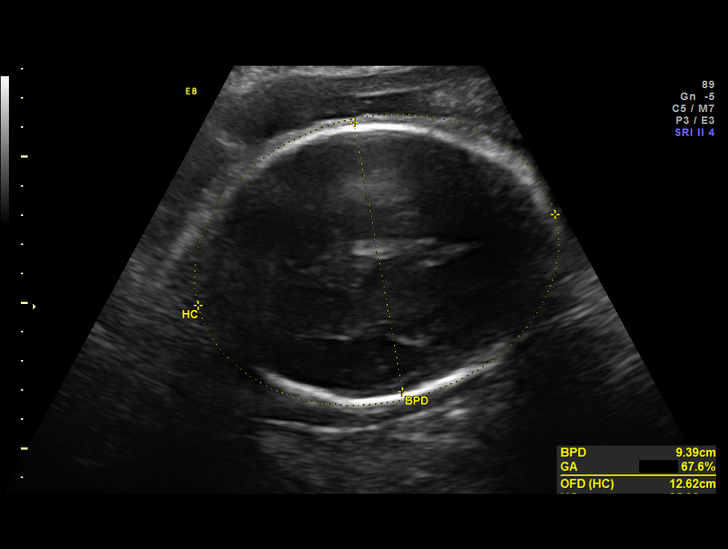
[im 4/29]
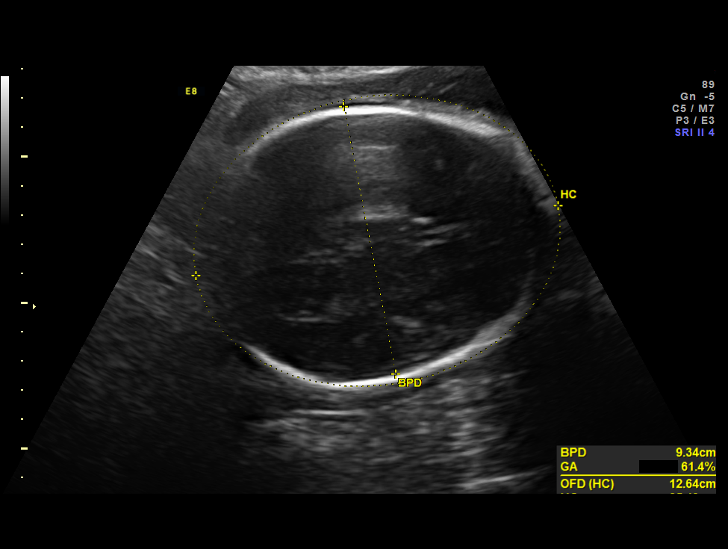
[im 6/29]
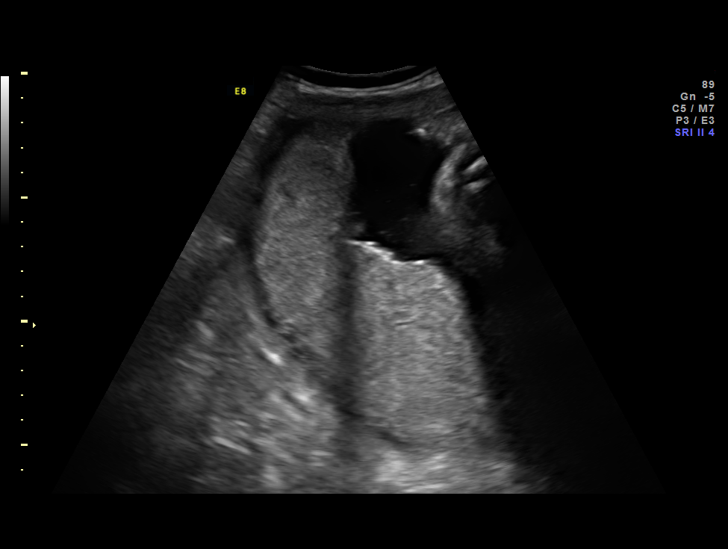
[im 9/29]
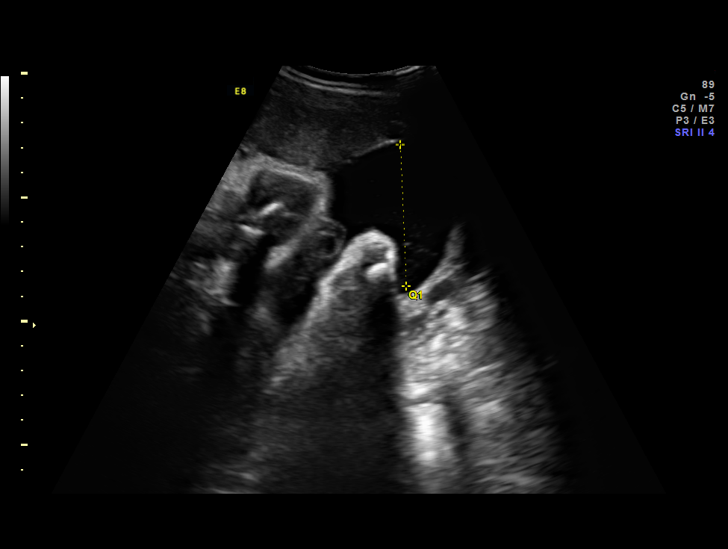
[im 11/29]
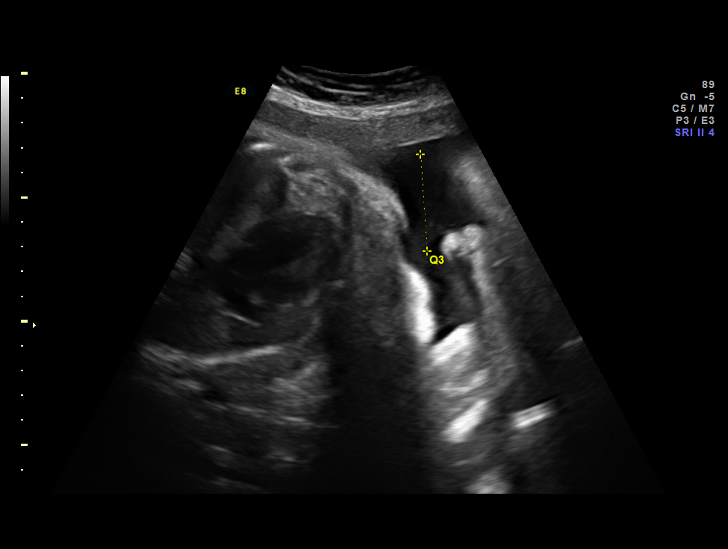
[im 13/29]
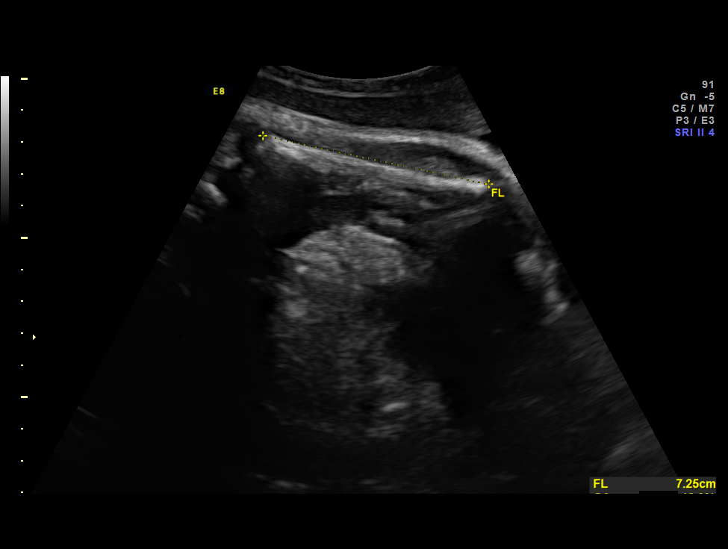
[im 16/29]
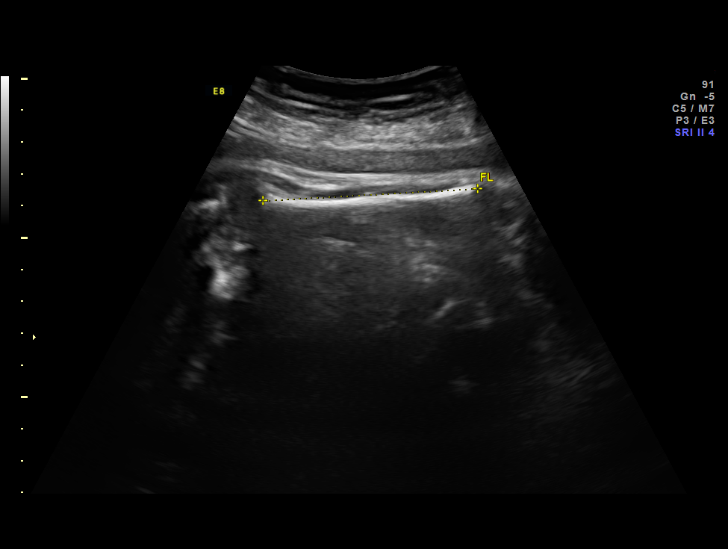
[im 18/29]
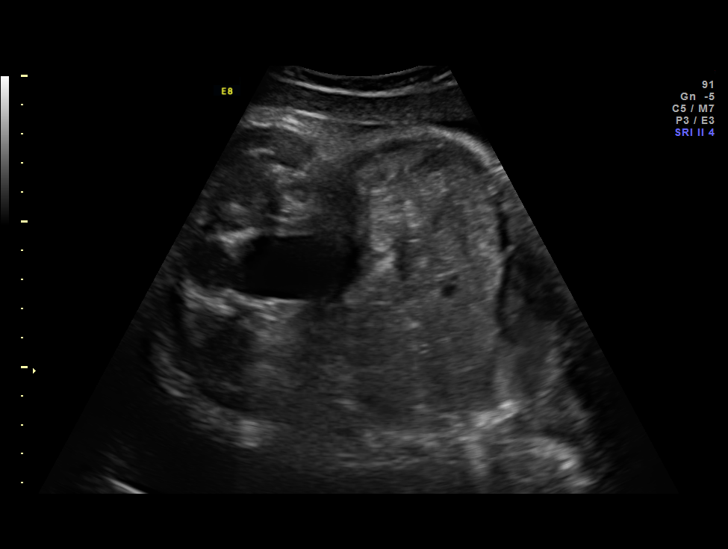
[im 20/29]
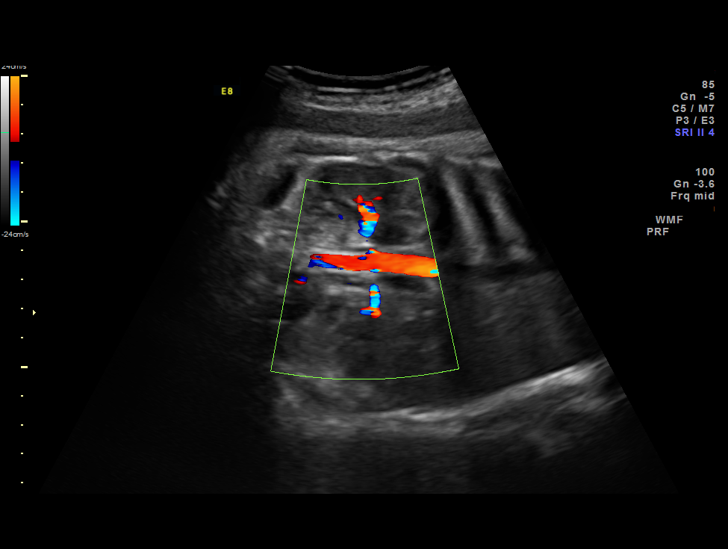
[im 23/29]
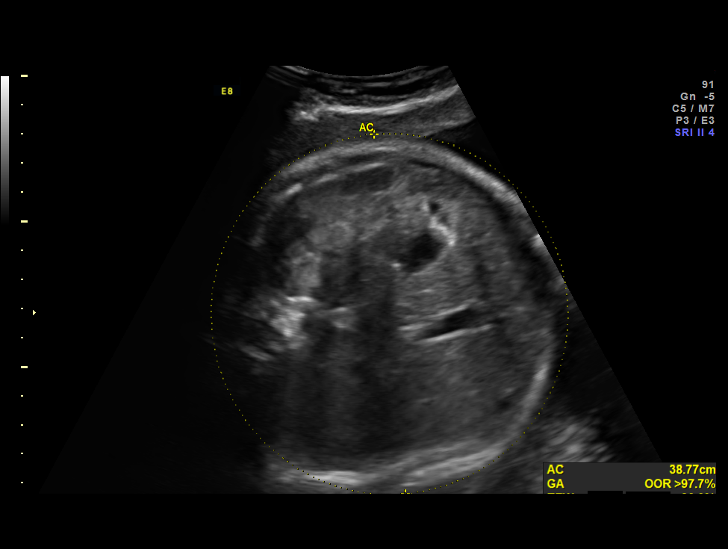
[im 25/29]
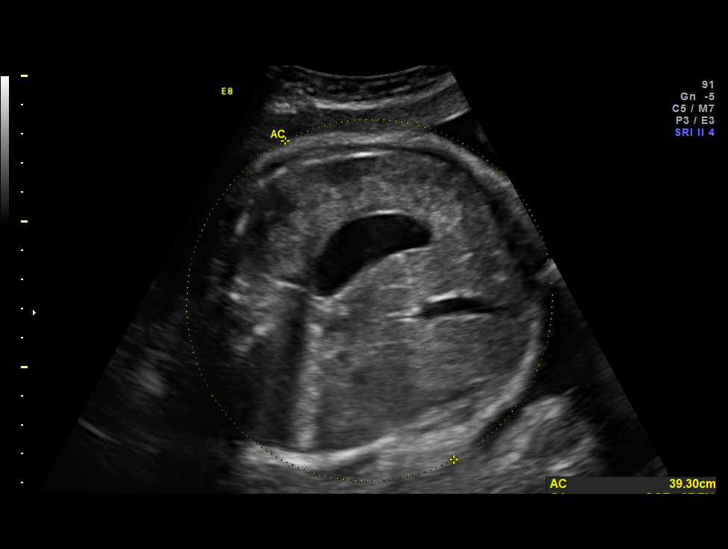
[im 27/29]
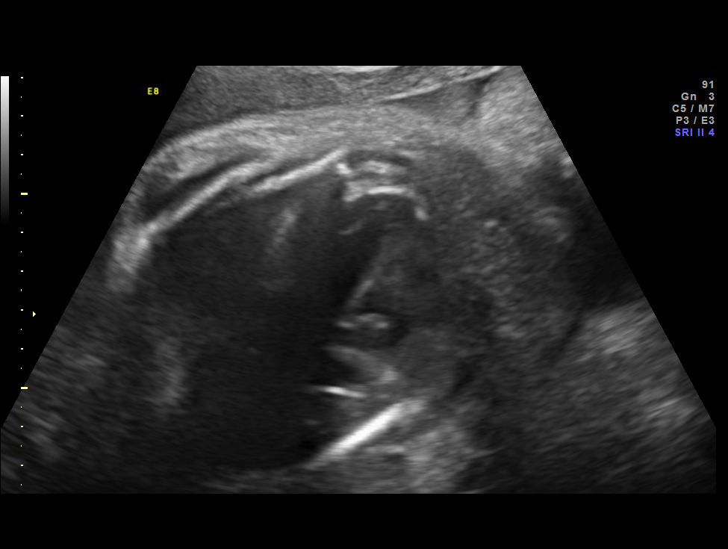

[12 of 28 positions shown; findings below may reference images not displayed]

OBSTETRICS REPORT
                      (Signed Final 05/01/2013 [DATE])

Service(s) Provided

 US OB FOLLOW UP                                       76816.1
Indications

 Size greater than dates (Large for gestational [AGE]
 h/o shoulder dystocia
 Advanced maternal age (37), Multigravida;
 declined testing
Fetal Evaluation

 Num Of Fetuses:    1
 Fetal Heart Rate:  138                          bpm
 Cardiac Activity:  Observed
 Presentation:      Cephalic
 Placenta:          Posterior Fundal, above
                    cervical os
 P. Cord            Previously Visualized
 Insertion:

 Amniotic Fluid
 AFI FV:      Subjectively within normal limits
 AFI Sum:     17.41   cm       70  %Tile     Larg Pckt:    5.72  cm
 RUQ:   5.72    cm   RLQ:    3.61   cm    LUQ:   4.16    cm   LLQ:    3.92   cm
Biometry

 BPD:     93.7  mm     G. Age:  38w 1d                CI:         74.2   70 - 86
 OFD:    126.3  mm                                    FL/HC:      20.1   20.6 -

 HC:     354.1  mm     G. Age:  41w 3d       91  %    HC/AC:      0.91   0.87 -

 AC:     391.2  mm     G. Age:  N/A        > 97  %    FL/BPD:     75.8   71 - 87
 FL:        71  mm     G. Age:  36w 3d        9  %    FL/AC:      18.1   20 - 24
 HUM:     65.5  mm     G. Age:  38w 0d       65  %

 Est. FW:    5393  gm      9 lb 7 oz   > 90  %
Gestational Age

 LMP:           38w 4d        Date:  08/04/12                 EDD:   05/11/13
 U/S Today:     38w 5d                                        EDD:   05/10/13
 Best:          38w 4d     Det. By:  LMP  (08/04/12)          EDD:   05/11/13
Anatomy

 Cranium:          Previously seen        Aortic Arch:      Previously seen
 Fetal Cavum:      Previously seen        Ductal Arch:      Previously seen
 Ventricles:       Appears normal         Diaphragm:        Appears normal
 Choroid Plexus:   Previously seen        Stomach:          Appears normal, left
                                                            sided
 Cerebellum:       Previously seen        Abdomen:          Previously seen
 Posterior Fossa:  Previously seen        Abdominal Wall:   Previously seen
 Nuchal Fold:      Previously seen        Cord Vessels:     Previously seen
 Face:             Orbits and profile     Kidneys:          Appear normal
                   previously seen
 Lips:             Previously seen        Bladder:          Appears normal
 Heart:            Appears normal         Spine:            Previously seen
                   (4CH, axis, and
                   situs)
 RVOT:             Previously seen        Lower             Previously seen
                                          Extremities:
 LVOT:             Previously seen        Upper             Previously seen
                                          Extremities:

 Other:  Heels previously seen. Parents do not wish to know sex of fetus.
Cervix Uterus Adnexa

 Cervix:       Not visualized (advanced GA >35wks)
Impression

 IUP at 38+4 weeks
 Cephalic
 Normal interval anatomy; anatomic survey complete
 Normal amniotic fluid volume
 EFW > 90th %tile, AC > 97th %tile; fetus at risk to be
 LGA/macrosomic
Recommendations

 Follow-up as clinically indicated
 Would deliver by EDC

 questions or concerns.

## 2014-12-07 ENCOUNTER — Encounter: Payer: Self-pay | Admitting: Obstetrics & Gynecology

## 2014-12-07 ENCOUNTER — Ambulatory Visit (INDEPENDENT_AMBULATORY_CARE_PROVIDER_SITE_OTHER): Payer: 59 | Admitting: Obstetrics & Gynecology

## 2014-12-07 VITALS — BP 122/75 | HR 16 | Resp 16 | Ht 64.0 in | Wt 175.0 lb

## 2014-12-07 DIAGNOSIS — N921 Excessive and frequent menstruation with irregular cycle: Secondary | ICD-10-CM | POA: Diagnosis not present

## 2014-12-07 NOTE — Progress Notes (Signed)
   Subjective:    Patient ID: Lindsey Porter, female    DOB: 1976/02/22, 39 y.o.   MRN: [id number]  HPI  Patient is a 39 year old female who is complaining of a 3 week period of bleeding. This was after she completed her menses 5 days prior. The patient had a C-section approximately 18 months ago. She was amenorrheic for 9 months. The next 8 months she experienced 1 menses a month. This past months she started her menstrual cycle and time. It ended after 5 days. She then bled on and off continuously for 3 weeks. Patient denies any pelvic pain vaginal odor constipation dysuria. Patient also unsure if she'll of the tampon in her vagina.  Review of Systems  Constitutional: Negative.   Respiratory: Negative.   Cardiovascular: Negative.   Gastrointestinal: Negative.   Genitourinary: Positive for vaginal bleeding and menstrual problem. Negative for dysuria, vaginal discharge, vaginal pain and pelvic pain.  Psychiatric/Behavioral: Negative.        Objective:   Physical Exam  Constitutional: She is oriented to person, place, and time. She appears well-developed and well-nourished. No distress.  HENT:  Head: Normocephalic and atraumatic.  Eyes: Conjunctivae are normal.  Pulmonary/Chest: Effort normal.  Abdominal: Soft. Bowel sounds are normal. She exhibits no distension and no mass. There is no tenderness. There is no rebound and no guarding.  Musculoskeletal: She exhibits no edema.  Neurological: She is alert and oriented to person, place, and time.  Skin: Skin is warm and dry.  Psychiatric: She has a normal mood and affect.  Vitals reviewed.   Filed Vitals:   12/07/14 1447  BP: 122/75  Pulse: 16  Resp: 16  Height:  (1.626 m)  Weight: 175 lb (79.379 kg)         Assessment & Plan:  39 year old female with metrorrhagia for 1 month. 1-urine pregnancy test negative 2-TSH ordered today 3-complete pelvic and transvaginal ultrasound. 4-Will treat based on results. If normal  we'll not do biopsy at this time given that she is status post childbirth 18 months ago. Very low likelihood of endometrial hyperplasia or cancer if she has another abnormal cycle we'll do biopsy.

## 2014-12-08 LAB — TSH: TSH: 2.021 u[IU]/mL (ref 0.350–4.500)

## 2014-12-09 ENCOUNTER — Telehealth: Payer: Self-pay | Admitting: *Deleted

## 2014-12-09 NOTE — Telephone Encounter (Signed)
Pt notified of normal lab work. 

## 2014-12-09 NOTE — Telephone Encounter (Signed)
-----   Message from Lesly Dukes, MD sent at 12/08/2014  6:16 AM EDT ----- Call the patient and let them know their lab results are normal.  Thanks!!

## 2014-12-17 ENCOUNTER — Ambulatory Visit (HOSPITAL_COMMUNITY)
Admission: RE | Admit: 2014-12-17 | Discharge: 2014-12-17 | Disposition: A | Discharge status: Home / Self Care | Payer: 59 | Source: Ambulatory Visit | Attending: Obstetrics & Gynecology | Admitting: Obstetrics & Gynecology

## 2014-12-17 DIAGNOSIS — N921 Excessive and frequent menstruation with irregular cycle: Secondary | ICD-10-CM

## 2014-12-17 DIAGNOSIS — N939 Abnormal uterine and vaginal bleeding, unspecified: Secondary | ICD-10-CM | POA: Diagnosis not present

## 2014-12-28 ENCOUNTER — Telehealth: Payer: Self-pay | Admitting: *Deleted

## 2014-12-28 NOTE — Telephone Encounter (Signed)
Pt notified of U/S results.  She states that she has finally stopped bleeding last week and will just watch things right now.

## 2014-12-28 NOTE — Telephone Encounter (Signed)
-----   Message from Lesly Dukes, MD sent at 12/26/2014  2:19 PM EDT ----- Normal [place].  RN to call with results and see how bleeding is.  Can initiate hormonal therapy and consider biopsy if continues.

## 2015-06-16 ENCOUNTER — Ambulatory Visit (INDEPENDENT_AMBULATORY_CARE_PROVIDER_SITE_OTHER): Payer: Managed Care, Other (non HMO) | Admitting: Obstetrics & Gynecology

## 2015-06-16 ENCOUNTER — Encounter: Payer: Self-pay | Admitting: Obstetrics & Gynecology

## 2015-06-16 VITALS — BP 123/82 | HR 81 | Ht 63.0 in | Wt 175.0 lb

## 2015-06-16 DIAGNOSIS — N926 Irregular menstruation, unspecified: Secondary | ICD-10-CM

## 2015-06-16 MED ORDER — MEDROXYPROGESTERONE ACETATE 10 MG PO TABS: 10.0000 mg | ORAL_TABLET | Freq: Every day | ORAL | Status: AC

## 2015-06-16 MED ORDER — MEDROXYPROGESTERONE ACETATE 10 MG PO TABS: 10.0000 mg | ORAL_TABLET | Freq: Every day | ORAL | Status: DC

## 2015-06-16 NOTE — Progress Notes (Signed)
   Subjective:    Patient ID: Lindsey Porter, female    DOB: 1976-03-24, 40 y.o.   MRN: [id number]  HPI 40 yo MW P10 (the youngest is 42 yo, breastfeeding only at night). She is here today because her LMP was 04/16/15 (has skipped the last 2 months). She uses condoms and rhythm method for contracepiton. She denies hot flashes.   Review of Systems  TSH was normal 8/16    Objective:   Physical Exam Obese WF Breathing, conversing, and ambulating normally UPT negative here today     Assessment & Plan:  Skipped 2 periods- reassurance given I have prescribed provera to induce a withdrawal bleed. She and her husband think that she may wait one more month prior to taking it. I rec'd taking a UPT prior to taking it.

## 2015-06-16 NOTE — Addendum Note (Signed)
Addended by: Anell BarrHOWARD, Mihir Flanigan L on: 06/16/2015 10:36 AM   Modules accepted: Orders
